# Patient Record
Sex: Male | Born: 1950 | Race: White | Hispanic: No | Marital: Married | Smoking: Never smoker
Health system: Southern US, Community
[De-identification: ages and names within clinical notes are randomized; demographics above are authoritative.]

## PROBLEM LIST (undated history)

## (undated) DIAGNOSIS — F419 Anxiety disorder, unspecified: Secondary | ICD-10-CM

## (undated) DIAGNOSIS — Z87442 Personal history of urinary calculi: Secondary | ICD-10-CM

## (undated) DIAGNOSIS — N400 Enlarged prostate without lower urinary tract symptoms: Secondary | ICD-10-CM

## (undated) DIAGNOSIS — F0390 Unspecified dementia without behavioral disturbance: Secondary | ICD-10-CM

## (undated) DIAGNOSIS — F32A Depression, unspecified: Secondary | ICD-10-CM

## (undated) HISTORY — PX: TOTAL HIP ARTHROPLASTY: SHX124

## (undated) HISTORY — DX: Unspecified dementia, unspecified severity, without behavioral disturbance, psychotic disturbance, mood disturbance, and anxiety: F03.90

---

## 2008-09-27 ENCOUNTER — Inpatient Hospital Stay (HOSPITAL_COMMUNITY): Admission: RE | Admit: 2008-09-27 | Discharge: 2008-09-29 | Payer: Self-pay | Admitting: Orthopedic Surgery

## 2011-03-26 NOTE — Discharge Summary (Signed)
Mike Pennington, Mike Pennington               ACCOUNT NO.:  [id number]   MEDICAL RECORD NO.:  [id number]          PATIENT TYPE:  INP   LOCATION:  NA                           FACILITY:  [facility]   PHYSICIAN:  Madlyn Frankel. Charlann Boxer, M.D.  DATE OF BIRTH:  [birth year]   DATE OF ADMISSION:  09/27/2008  DATE OF DISCHARGE:                               DISCHARGE SUMMARY   PROCEDURE:  Right total hip replacement.   CHIEF COMPLAINT:  Right hip pain.   HISTORY OF PRESENT ILLNESS:  A 60 year old male with a history of right  hip pain secondary to osteoarthritis.  It has been refractory to all  conservative treatment.  He has had diminished quality of life and  intractable pain.   PAST MEDICAL HISTORY:  Osteoarthritis.   PAST SURGICAL HISTORY:  Hand surgery.   FAMILY HISTORY:  Noncontributory.   SOCIAL HISTORY:  Nonsmoker.   DRUG ALLERGIES:  NO KNOWN DRUG ALLERGIES.   MEDICATIONS:  Advil p.r.n.   REVIEW OF SYSTEMS:  See HPI.   PHYSICAL EXAM:  Pulse 71.  Respirations 16.  Blood pressure 147/84.  Temperature 97.5.  GENERAL:  Awake, alert, oriented, well developed, well nourished,  healthy appearing.  NECK:  Supple.  No carotid bruits.  CHEST:  Lungs clear to auscultation bilaterally.  BREASTS:  Deferred.  HEART:  Regular rate and rhythm.  S1 and S2 distinct.  ABDOMEN:  Soft, nontender, nondistended.  Bowel sounds present.  GENITOURINARY:  Deferred.  EXTREMITIES:  Right hip, increase pain with decreased range of motion.  SKIN:  No cellulitis.  NEUROLOGIC:  Intact distal sensibilities.   LABS:  EKG, chest x-ray, all pending presurgical testing.   IMPRESSION:  Right hip osteoarthritis.   PLAN OF ACTION:  Right total hip replacement by surgeon, Dr. Durene Romans, on September 27, 2008, at [facility].  Risks and  complications discussed.  No postoperative medication provided at time  of history and physical.     ______________________________  Yetta Glassman. [address], [place]      Madlyn Frankel.  Charlann Boxer, M.D.  Electronically Signed    BLM/MEDQ  D:  09/27/2008  T:  09/27/2008  Job:  [id number]

## 2011-03-26 NOTE — Op Note (Signed)
NAMEJOREN, REHM               ACCOUNT NO.:  [id number]   MEDICAL RECORD NO.:  [id number]          PATIENT TYPE:  INP   LOCATION:  NA                           FACILITY:  [facility]   PHYSICIAN:  Madlyn Frankel. Charlann Boxer, M.D.  DATE OF BIRTH:  08/22/1951   DATE OF PROCEDURE:  09/27/2008  DATE OF DISCHARGE:                               OPERATIVE REPORT   PREOPERATIVE DIAGNOSIS:  Right hip osteoarthritis.   POSTOPERATIVE DIAGNOSIS:  Right hip osteoarthritis.   PROCEDURE:  Right total hip replacement.   COMPONENTS USED:  DePuy hip system size 58 pinnacle cup, single  cancellous screw and 40 mm neutral liner, a size 10 trial offset stem  with a 40, +5 ball.   SURGEON:  Madlyn Frankel. Charlann Boxer, M.D.   ASSISTANT:  Yetta Glassman. Mann, PA.   ANESTHESIA:  General.   BLOOD LOSS:  600 mL.   DRAINS:  Hemovac.   COMPLICATIONS:  None.   SPECIMEN:  None.   DISPOSITION:  Stable to the recovery room.   INDICATION FOR THE PROCEDURE:  Mr. Kwong is a 60 year old male who  presented with a 5-year history of right hip pain that has progressively  worsened.  It has significant reduced his quality of life.  He has not  had problems with his left hip.  Radiographs revealed end-stage  degenerative changes.  He had actually had a discussion about this hip  replaced in the past and was here for surgical consideration.  The risks  and benefits of the procedure were discussed.  Consent was obtained for  benefit of pain relief after reviewing the risks of infection, DVT,  component failure, dislocation, need for revision surgery also reviewing  the bearing surfaces available for his age.   PROCEDURE IN DETAIL:  The patient was brought to the operative theater.  Once adequate anesthesia and preoperative antibiotics, 2 grams of Ancef  administered the patient was positioned in the left lateral decubitus  position with the right side up.  The right lower extremity was pre-  scrubbed and prepped and draped in sterile  fashion.  A lateral based  incision was made for a posterior approach of the hip.  The gluteal  fascia and tensor fascia overlying the troch was incised posteriorly and  posterior exposure obtained.  The short external rotators were taken  down and separated from the posterior capsule.  An L capsulotomy was  made preserving the posterior capsule.  The hip was dislocated.  A neck  osteotomy was made into the trochanteric fossa, measuring it and  utilizing the 28 mm head, held in the center of the femoral head using  the 7 high offset neck.  Following this neck osteotomy, attended to the  femur first.  I used a box osteotome to remove bone out of the lateral  aspect of the proximal femur.  I then used a starting drill and a hand  reamer times one.  I then irrigated the canal to prevent fat emboli.  I  then began broaching with a size 2.  The broaching was carried out to a  size 8  broach initially.  I used a calcar planer to finish off a little  bit of the medial neck cut.   I packed off the femur at this point and I attended to the acetabulum.  Acetabulum was exposed and noted to have significant calcified labrum  circumferentially.  I used a rongeur, as well as an osteotome to debride  some of the posterior osteophytes, as well as anterior osteophytes.  I  began reaming with a 45 reamer and then went the all the way up  initially to a 55 reamer.  At this point, there was still a little bit  of medial osteophyte noted so I reamed down with the 56 and then 57 and  chose the 58 mm cup.  I impacted this cup which appeared to be about 20  degrees of forward flexion, 40 degrees of abduction.  Once I had this  securely fit with initial scratch fit and a single cancellous screw in  the ilium I used an osteotome to remove remaining osteophytic bone.  I  checked the position of my cup with the hip guide and was happy that the  cup appeared to be in about 35-40 degrees of abduction, 20 degrees of   flexion.  I placed a single cancellous screw into the ilium.  The hole  eliminator was placed and the final neutral 40 mm liner was placed.   This was done without complication.  Trial reduction was now carried  out.  Initially I put the 8 stem back in with a high offset neck and 40,  +1.45 ball.  With this in place the patient was noted to have a lot of  shuck in extension.  For this reason I went ahead and broached to a 9  which sat at the level neck cut and because there not much of difference  from where the 8 was I went up to a size 10.  With a size 10 stem in  place I trialed with a 41.5 ball again.  At this point there was about a  mm or 2 of shuck.  The stability of the hip excellent and no signs of  impingement.   The leg lengths compared to the down leg with the difference truly  determined based on the fact the patient had significant preoperative  stiffness.  However, there did not at this point did it appear that the  leg had been lengthened at all.   At this point, I removed the trial component.  We selected the 10 high  offset stem and it was impacted to the level where the broach had sat.  I retrialed again with a 1.5.  I then trialed with a 40, +5 ball.  With  this there was about a mm of shuck.  The superior hip was greatly giving  the hip leg length, but there did not appear to be lengthened and it  appeared to be stable.  Given all these parameters then the final 40, +5  ball was chosen and impacted onto the clean and dry trunnion.  Hip was  reduced.  The wound was irrigated and the hip throughout the case and  again at this point.  I reapproximated the posterior capsule to the  superior capsule, as well as the gluteus minimus fascia with a #1  Vicryl.  A medium Hemovac drain was placed deep in the capsular tissues.  The iliotibial band and tensor fascia and gluteal fascia were then all  reapproximated using #  1 Vicryl and over the top of this posterior wound.   The  remainder of the wound was closed with 2-0 Vicryl and running 4-0  Monocryl.  The hip was cleaned, dried, and dressed sterilely with Steri-  Strips with a Mepilex dressing.  He was brought to the recovery room,  extubated in stable condition tolerating the procedure well.      Madlyn Frankel Charlann Boxer, M.D.  Electronically Signed     MDO/MEDQ  D:  09/27/2008  T:  09/28/2008  Job:  [id number]

## 2011-08-13 LAB — BASIC METABOLIC PANEL
BUN: 11
BUN: 23
CO2: 26
CO2: 28
Calcium: 8.6
Calcium: 9.5
Chloride: 105
Creatinine, Ser: 0.88
GFR calc Af Amer: >60
GFR calc Af Amer: >60
Glucose, Bld: 152 — ABNORMAL HIGH
Glucose, Bld: 96
Potassium: 4.4
Sodium: 137
Sodium: 139

## 2011-08-13 LAB — URINALYSIS, ROUTINE W REFLEX MICROSCOPIC
Bilirubin Urine: NEGATIVE
Glucose, UA: NEGATIVE
Hgb urine dipstick: NEGATIVE
Ketones, ur: NEGATIVE
Nitrite: NEGATIVE
Protein, ur: NEGATIVE
pH: 6.5

## 2011-08-13 LAB — CBC
HCT: 36.2 — ABNORMAL LOW
HCT: 47.8
Hemoglobin: 12.6 — ABNORMAL LOW
MCHC: 33.6
MCHC: 34
MCV: 92.2
Platelets: 193
Platelets: 258
RBC: 5.19
RDW: 13.4
RDW: 13.6
WBC: 6.9
WBC: 7.5
WBC: 7.7

## 2011-08-13 LAB — TYPE AND SCREEN: Antibody Screen: NEGATIVE

## 2011-08-13 LAB — DIFFERENTIAL
Basophils Relative: 0
Eosinophils Relative: 4

## 2011-08-13 LAB — ABO/RH: ABO/RH(D): O POS

## 2011-08-13 LAB — PROTIME-INR: INR: 1.2

## 2019-12-06 ENCOUNTER — Ambulatory Visit: Payer: Self-pay | Attending: Internal Medicine

## 2019-12-06 DIAGNOSIS — Z23 Encounter for immunization: Secondary | ICD-10-CM | POA: Insufficient documentation

## 2019-12-06 NOTE — Progress Notes (Signed)
   Covid-19 Vaccination Clinic  Name:  Mike Pennington    MRN: [id number] DOB: April 19, 1951  12/06/2019  Mike Pennington was observed post Covid-19 immunization for 15 minutes without incidence. He was provided with Vaccine Information Sheet and instruction to access the V-Safe system.   Mike Pennington was instructed to call 911 with any severe reactions post vaccine: Marland Kitchen Difficulty breathing  . Swelling of your face and throat  . A fast heartbeat  . A bad rash all over your body  . Dizziness and weakness    Immunizations Administered    Name Date Dose VIS Date Route   Pfizer COVID-19 Vaccine 12/06/2019  1:41 PM 0.3 mL 10/22/2019 Intramuscular   Manufacturer: [place]   Lot: [id number]   [place]: [id number]

## 2019-12-08 ENCOUNTER — Ambulatory Visit: Payer: Self-pay

## 2019-12-16 ENCOUNTER — Ambulatory Visit: Payer: Self-pay | Attending: Internal Medicine

## 2019-12-24 ENCOUNTER — Ambulatory Visit: Payer: Self-pay

## 2019-12-24 ENCOUNTER — Ambulatory Visit: Payer: Self-pay | Attending: Internal Medicine

## 2019-12-24 DIAGNOSIS — Z23 Encounter for immunization: Secondary | ICD-10-CM | POA: Insufficient documentation

## 2019-12-24 NOTE — Progress Notes (Signed)
   Covid-19 Vaccination Clinic  Name:  Mike Pennington    MRN: [id number] DOB: Sep 11, 1951  12/24/2019  [name] was observed post Covid-19 immunization for 15 minutes without incidence. He was provided with Vaccine Information Sheet and instruction to access the V-Safe system.   [name] was instructed to call 911 with any severe reactions post vaccine: Marland Kitchen Difficulty breathing  . Swelling of your face and throat  . A fast heartbeat  . A bad rash all over your body  . Dizziness and weakness    Immunizations Administered    Name Date Dose VIS Date Route   Pfizer COVID-19 Vaccine 12/24/2019  1:15 PM 0.3 mL 10/22/2019 Intramuscular   Manufacturer: [place]   Lot: [id number]   [place]: [id number]

## 2019-12-25 ENCOUNTER — Ambulatory Visit: Payer: Self-pay

## 2020-03-22 ENCOUNTER — Other Ambulatory Visit: Payer: Self-pay | Admitting: Orthopedic Surgery

## 2020-03-22 DIAGNOSIS — M25551 Pain in right hip: Secondary | ICD-10-CM

## 2020-05-01 ENCOUNTER — Ambulatory Visit
Admission: RE | Admit: 2020-05-01 | Discharge: 2020-05-01 | Disposition: A | Discharge status: Home / Self Care | Payer: Medicare PPO | Source: Ambulatory Visit | Attending: Orthopedic Surgery | Admitting: Orthopedic Surgery

## 2020-05-01 DIAGNOSIS — M25551 Pain in right hip: Secondary | ICD-10-CM

## 2020-05-19 ENCOUNTER — Other Ambulatory Visit: Payer: Self-pay | Admitting: Urology

## 2020-05-19 DIAGNOSIS — R972 Elevated prostate specific antigen [PSA]: Secondary | ICD-10-CM

## 2020-06-17 ENCOUNTER — Other Ambulatory Visit: Payer: Medicare PPO

## 2020-06-18 ENCOUNTER — Inpatient Hospital Stay: Admission: RE | Admit: 2020-06-18 | Payer: Medicare PPO | Source: Ambulatory Visit

## 2020-06-28 ENCOUNTER — Ambulatory Visit (HOSPITAL_COMMUNITY): Payer: Medicare PPO

## 2020-06-30 ENCOUNTER — Other Ambulatory Visit (HOSPITAL_COMMUNITY): Payer: Self-pay | Admitting: Urology

## 2020-06-30 DIAGNOSIS — R972 Elevated prostate specific antigen [PSA]: Secondary | ICD-10-CM

## 2020-07-05 ENCOUNTER — Ambulatory Visit (HOSPITAL_COMMUNITY): Payer: Medicare PPO

## 2020-07-05 ENCOUNTER — Ambulatory Visit (HOSPITAL_COMMUNITY)
Admission: RE | Admit: 2020-07-05 | Discharge: 2020-07-05 | Disposition: A | Discharge status: Home / Self Care | Payer: Medicare PPO | Source: Ambulatory Visit | Attending: Urology | Admitting: Urology

## 2020-07-05 ENCOUNTER — Other Ambulatory Visit: Payer: Self-pay

## 2020-07-05 DIAGNOSIS — R972 Elevated prostate specific antigen [PSA]: Secondary | ICD-10-CM | POA: Insufficient documentation

## 2020-07-05 MED ORDER — GADOBUTROL 1 MMOL/ML IV SOLN
9.0000 mL | Freq: Once | INTRAVENOUS | Status: AC | PRN
  Administered 2020-07-05: 9 mL via INTRAVENOUS

## 2021-05-07 ENCOUNTER — Emergency Department (HOSPITAL_BASED_OUTPATIENT_CLINIC_OR_DEPARTMENT_OTHER): Payer: Medicare PPO

## 2021-05-07 ENCOUNTER — Encounter (HOSPITAL_BASED_OUTPATIENT_CLINIC_OR_DEPARTMENT_OTHER): Payer: Self-pay | Admitting: *Deleted

## 2021-05-07 ENCOUNTER — Emergency Department (HOSPITAL_BASED_OUTPATIENT_CLINIC_OR_DEPARTMENT_OTHER)
Admission: EM | Admit: 2021-05-07 | Discharge: 2021-05-07 | Disposition: A | Discharge status: Home / Self Care | Payer: Medicare PPO | Attending: Emergency Medicine | Admitting: Emergency Medicine

## 2021-05-07 ENCOUNTER — Other Ambulatory Visit: Payer: Self-pay

## 2021-05-07 DIAGNOSIS — F0390 Unspecified dementia without behavioral disturbance: Secondary | ICD-10-CM | POA: Insufficient documentation

## 2021-05-07 DIAGNOSIS — K5900 Constipation, unspecified: Secondary | ICD-10-CM

## 2021-05-07 DIAGNOSIS — K640 First degree hemorrhoids: Secondary | ICD-10-CM | POA: Diagnosis not present

## 2021-05-07 DIAGNOSIS — Z96641 Presence of right artificial hip joint: Secondary | ICD-10-CM | POA: Insufficient documentation

## 2021-05-07 DIAGNOSIS — K6289 Other specified diseases of anus and rectum: Secondary | ICD-10-CM | POA: Diagnosis present

## 2021-05-07 LAB — BASIC METABOLIC PANEL
Anion gap: 8 (ref 5–15)
BUN: 32 mg/dL — ABNORMAL HIGH (ref 8–23)
CO2: 22 mmol/L (ref 22–32)
Calcium: 9 mg/dL (ref 8.9–10.3)
Chloride: 106 mmol/L (ref 98–111)
Creatinine, Ser: 1 mg/dL (ref 0.61–1.24)
GFR, Estimated: >60 mL/min (ref >60)
Glucose, Bld: 104 mg/dL — ABNORMAL HIGH (ref 70–99)
Potassium: 4 mmol/L (ref 3.5–5.1)
Sodium: 136 mmol/L (ref 135–145)

## 2021-05-07 MED ORDER — ACETAMINOPHEN 500 MG PO TABS
1000.0000 mg | ORAL_TABLET | Freq: Once | ORAL | Status: AC
  Administered 2021-05-07: 1000 mg via ORAL
  Filled 2021-05-07: qty 2

## 2021-05-07 MED ORDER — MINERAL OIL RE ENEM
1.0000 | ENEMA | Freq: Once | RECTAL | Status: AC
  Administered 2021-05-07: 1 via RECTAL
  Filled 2021-05-07: qty 1

## 2021-05-07 NOTE — ED Notes (Signed)
Patient transported to X-ray 

## 2021-05-07 NOTE — ED Notes (Signed)
Pt sts able to hold enema for 15 minutes. Currently on bedside commode attempting to have a bowel movement.

## 2021-05-07 NOTE — ED Provider Notes (Signed)
[place] EMERGENCY DEPARTMENT Provider Note   CSN: [id number] Arrival date & time: 05/07/21  0026     History Chief Complaint  Patient presents with   Rectal Pain    Mike Pennington is a 70 y.o. male.  The history is provided by the spouse. The history is limited by the condition of the patient (level 5 caveat dementia).  Illness Location:  Rectum Quality:  Pain Severity:  Severe Onset quality:  Gradual Duration:  1 day Timing:  Constant Progression:  Unchanged Chronicity:  New Context:  Dementia Relieved by:  Nothing Worsened by:  Nothing Ineffective treatments:  None Associated symptoms: no abdominal pain, no chest pain, no diarrhea, no fever, no vomiting and no wheezing   Risk factors:  Dementia     Past Medical History:  Diagnosis Date   Dementia ([place])     There are no problems to display for this patient.   Past Surgical History:  Procedure Laterality Date   TOTAL HIP ARTHROPLASTY Right        History reviewed. No pertinent family history.  Social History   Tobacco Use   Smoking status: Never   Smokeless tobacco: Never  Substance Use Topics   Alcohol use: Never   Drug use: Never    Home Medications Prior to Admission medications   Not on File    Allergies    Patient has no known allergies.  Review of Systems   Review of Systems  Unable to perform ROS: Dementia  Constitutional:  Negative for fever.  HENT:  Negative for drooling.   Eyes:  Negative for redness.  Respiratory:  Negative for wheezing.   Cardiovascular:  Negative for chest pain.  Gastrointestinal:  Negative for abdominal pain, diarrhea and vomiting.  Skin:  Negative for wound.  Psychiatric/Behavioral:  Negative for agitation.    Physical Exam Updated Vital Signs BP (!) 158/98   Pulse 71   Temp 97.9 F (36.6 C) (Oral)   Resp 18   Ht 6' (1.829 m)   Wt 83.9 kg   SpO2 99%   BMI 25.09 kg/m   Physical Exam Vitals and nursing note reviewed. Exam  conducted with a chaperone present.  Constitutional:      General: He is not in acute distress.    Appearance: Normal appearance.  HENT:     Head: Normocephalic and atraumatic.     Nose: Nose normal.  Eyes:     Conjunctiva/sclera: Conjunctivae normal.     Pupils: Pupils are equal, round, and reactive to light.  Cardiovascular:     Rate and Rhythm: Normal rate and regular rhythm.     Pulses: Normal pulses.     Heart sounds: Normal heart sounds.  Pulmonary:     Effort: Pulmonary effort is normal.     Breath sounds: Normal breath sounds.  Abdominal:     General: Abdomen is flat. Bowel sounds are normal.     Palpations: Abdomen is soft.     Tenderness: There is no abdominal tenderness. There is no guarding or rebound.  Genitourinary:    Comments: Small hemorrhoid  Musculoskeletal:        General: Normal range of motion.     Cervical back: Normal range of motion.  Skin:    General: Skin is warm and dry.     Capillary Refill: Capillary refill takes less than 2 seconds.  Neurological:     Mental Status: He is alert.     Deep Tendon Reflexes: Reflexes normal.  Psychiatric:        Mood and Affect: Mood normal.        Behavior: Behavior normal.    ED Results / Procedures / Treatments   Labs (all labs ordered are listed, but only abnormal results are displayed) Labs Reviewed  BASIC METABOLIC PANEL    EKG None  Radiology DG Abdomen 1 View  Result Date: 05/07/2021 CLINICAL DATA:  Constipation, rectal pain EXAM: ABDOMEN - 1 VIEW COMPARISON:  11/20/2020 FINDINGS: Large stool burden throughout the colon, similar to prior study, including rectosigmoid colon. Cannot exclude fecal impaction. Findings similar to prior study. No organomegaly, free air or suspicious calcification. IMPRESSION: Large stool burden throughout the colon, including rectosigmoid colon. Cannot exclude fecal impaction. Electronically Signed   By: Rolm Baptise M.D.   On: 05/07/2021 02:01    Procedures Procedures    Medications Ordered in ED Medications  acetaminophen (TYLENOL) tablet 1,000 mg (1,000 mg Oral Given 05/07/21 0226)  mineral oil enema 1 enema (1 enema Rectal Given 05/07/21 0231)    ED Course  I have reviewed the triage vital signs and the nursing notes.  Pertinent labs & imaging results that were available during my care of the patient were reviewed by me and considered in my medical decision making (see chart for details).   Given emema.  Will need to start daily miralax therapy and sitz baths and preparation H.    Mike Pennington was evaluated in Emergency Department on 05/07/2021 for the symptoms described in the history of present illness. He was evaluated in the context of the global COVID-19 pandemic, which necessitated consideration that the patient might be at risk for infection with the SARS-CoV-2 virus that causes COVID-19. Institutional protocols and algorithms that pertain to the evaluation of patients at risk for COVID-19 are in a state of rapid change based on information released by regulatory bodies including the CDC and federal and state organizations. These policies and algorithms were followed during the patient's care in the ED.  Final Clinical Impression(s) / ED Diagnoses Final diagnoses:  Grade I hemorrhoids  Constipation, unspecified constipation type    Return for intractable cough, coughing up blood, fevers > 100.4 unrelieved by medication, shortness of breath, intractable vomiting, chest pain, shortness of breath, weakness, numbness, changes in speech, facial asymmetry, abdominal pain, passing out, Inability to tolerate liquids or food, cough, altered mental status or any concerns. No signs of systemic illness or infection. The patient is nontoxic-appearing on exam and vital signs are within normal limits. I have reviewed the triage vital signs and the nursing notes. Pertinent labs & imaging results that were available during my care of the patient were reviewed by  me and considered in my medical decision making (see chart for details). After history, exam, and medical workup I feel the patient has been appropriately medically screened and is safe for discharge home. Pertinent diagnoses were discussed with the patient. Patient was given return precautions.  Rx / DC Orders ED Discharge Orders     None        Maezie Justin, MD 05/07/21 6754

## 2021-05-07 NOTE — ED Triage Notes (Signed)
Rectal pain since yesterday. Denies abdominal pain. Denies rectal bleeding.  Pts wife-Linda- at bedside-pt has hx of dementia.

## 2021-05-07 NOTE — ED Notes (Signed)
Pt had a large bowel movement. Reports relief and feeling better. Ambulatory around room afterwards.

## 2021-05-24 ENCOUNTER — Other Ambulatory Visit: Payer: Self-pay

## 2021-05-24 ENCOUNTER — Emergency Department (HOSPITAL_BASED_OUTPATIENT_CLINIC_OR_DEPARTMENT_OTHER)
Admission: EM | Admit: 2021-05-24 | Discharge: 2021-05-25 | Disposition: A | Discharge status: Home / Self Care | Payer: Medicare PPO | Attending: Emergency Medicine | Admitting: Emergency Medicine

## 2021-05-24 ENCOUNTER — Encounter (HOSPITAL_BASED_OUTPATIENT_CLINIC_OR_DEPARTMENT_OTHER): Payer: Self-pay

## 2021-05-24 DIAGNOSIS — F0390 Unspecified dementia without behavioral disturbance: Secondary | ICD-10-CM | POA: Insufficient documentation

## 2021-05-24 DIAGNOSIS — R1030 Lower abdominal pain, unspecified: Secondary | ICD-10-CM | POA: Diagnosis not present

## 2021-05-24 DIAGNOSIS — Z96641 Presence of right artificial hip joint: Secondary | ICD-10-CM | POA: Insufficient documentation

## 2021-05-24 DIAGNOSIS — R339 Retention of urine, unspecified: Secondary | ICD-10-CM | POA: Diagnosis not present

## 2021-05-24 DIAGNOSIS — R338 Other retention of urine: Secondary | ICD-10-CM

## 2021-05-24 LAB — COMPREHENSIVE METABOLIC PANEL
ALT: 21 U/L (ref 0–44)
AST: 30 U/L (ref 15–41)
Albumin: 4.5 g/dL (ref 3.5–5.0)
Alkaline Phosphatase: 54 U/L (ref 38–126)
Anion gap: 8 (ref 5–15)
BUN: 29 mg/dL — ABNORMAL HIGH (ref 8–23)
CO2: 25 mmol/L (ref 22–32)
Calcium: 9.4 mg/dL (ref 8.9–10.3)
Chloride: 103 mmol/L (ref 98–111)
Creatinine, Ser: 1.33 mg/dL — ABNORMAL HIGH (ref 0.61–1.24)
GFR, Estimated: 58 mL/min — ABNORMAL LOW (ref >60)
Glucose, Bld: 103 mg/dL — ABNORMAL HIGH (ref 70–99)
Potassium: 4.3 mmol/L (ref 3.5–5.1)
Sodium: 136 mmol/L (ref 135–145)
Total Bilirubin: 1 mg/dL (ref 0.3–1.2)
Total Protein: 7.6 g/dL (ref 6.5–8.1)

## 2021-05-24 LAB — CBC
HCT: 57.1 % — ABNORMAL HIGH (ref 39.0–52.0)
Hemoglobin: 18.9 g/dL — ABNORMAL HIGH (ref 13.0–17.0)
MCH: 26.9 pg (ref 26.0–34.0)
MCHC: 33.1 g/dL (ref 30.0–36.0)
MCV: 81.2 fL (ref 80.0–100.0)
Platelets: 293 10*3/uL (ref 150–400)
RBC: 7.03 MIL/uL — ABNORMAL HIGH (ref 4.22–5.81)
RDW: 23.9 % — ABNORMAL HIGH (ref 11.5–15.5)
WBC: 12.6 10*3/uL — ABNORMAL HIGH (ref 4.0–10.5)
nRBC: 0 % (ref 0.0–0.2)

## 2021-05-24 LAB — LIPASE, BLOOD: Lipase: 27 U/L (ref 11–51)

## 2021-05-24 NOTE — ED Triage Notes (Addendum)
Per wife pt with abd pain x 2 days-states he feels the need to void-goes to BR with no output then is incontinent of urine-states he was recently seen for constipation however normal BM/baseline today-NAD-answers some ?s-wife answers most ?s/pt with dementia hx-unsteady gait/baseline

## 2021-05-25 LAB — URINALYSIS, ROUTINE W REFLEX MICROSCOPIC
Bilirubin Urine: NEGATIVE
Glucose, UA: NEGATIVE mg/dL
Ketones, ur: NEGATIVE mg/dL
Nitrite: NEGATIVE
Protein, ur: 30 mg/dL — AB
Specific Gravity, Urine: 1.02 (ref 1.005–1.030)
pH: 5.5 (ref 5.0–8.0)

## 2021-05-25 LAB — URINALYSIS, MICROSCOPIC (REFLEX)

## 2021-05-25 NOTE — ED Notes (Signed)
Discharge instructions discussed with pt and wife. Pt and wife verbalized understanding. Pt stable and ambulatory. No signature pad available.

## 2021-05-25 NOTE — ED Provider Notes (Signed)
[place] DEPT [facility] Provider Note: [name], MD, FACEP  CSN: [id number] MRN: [id number] ARRIVAL: 05/24/21 at 2129 ROOM: MH10/MH10   CHIEF COMPLAINT  Abdominal Pain  Level 5 caveat: Dementia HISTORY OF PRESENT ILLNESS  05/25/21 12:45 AM Mike Pennington is a 70 y.o. male with a history of dementia.  His wife states he has been complaining of a sharp suprapubic pain for the past 2 days.  Sometimes when he attempts to urinate he is incontinent of small amounts of stool.  He was seen several weeks ago for constipation and has been on a regimen including MiraLAX and Colace with improvement.  His bowel movements have been fairly normal recently.  He feels the need to urinate but has difficulty voiding and then after attempting to urinate he is incontinent of urine.  He is unable to quantify his pain and denies any pain at the present time.   Past Medical History:  Diagnosis Date   Dementia [facility])     Past Surgical History:  Procedure Laterality Date   TOTAL HIP ARTHROPLASTY Right     No family history on file.  Social History   Tobacco Use   Smoking status: Never   Smokeless tobacco: Never  Substance Use Topics   Alcohol use: Never   Drug use: Never    Prior to Admission medications   Not on File    Allergies Patient has no known allergies.   REVIEW OF SYSTEMS  Level 5 caveat: Dementia   PHYSICAL EXAMINATION  Initial Vital Signs Blood pressure (!) 162/103, pulse 87, temperature 98.3 F (36.8 C), temperature source Oral, resp. rate 18, height 6' (1.829 m), weight 81.6 kg, SpO2 97 %.  Examination General: Well-developed, well-nourished male in no acute distress; appearance consistent with age of record HENT: normocephalic; atraumatic Eyes: Normal appearance Neck: supple Heart: regular rate and rhythm Lungs: clear to auscultation bilaterally Abdomen: soft; distended but nontender bladder, bedside bladder scan >468 mL; bowel sounds present Extremities:  No deformity; full range of motion; pulses normal Neurologic: Awake, alert; motor function intact in all extremities and symmetric; no facial droop Skin: Warm and dry Psychiatric: Flat affect   RESULTS  Summary of this visit's results, reviewed and interpreted by myself:   EKG Interpretation  Date/Time:    Ventricular Rate:    PR Interval:    QRS Duration:   QT Interval:    QTC Calculation:   R Axis:     Text Interpretation:         Laboratory Studies: Results for orders placed or performed during the hospital encounter of 05/24/21 (from the past 24 hour(s))  Lipase, blood     Status: None   Collection Time: 05/24/21 10:08 PM  Result Value Ref Range   Lipase 27 11 - 51 U/L  Comprehensive metabolic panel     Status: Abnormal   Collection Time: 05/24/21 10:08 PM  Result Value Ref Range   Sodium 136 135 - 145 mmol/L   Potassium 4.3 3.5 - 5.1 mmol/L   Chloride 103 98 - 111 mmol/L   CO2 25 22 - 32 mmol/L   Glucose, Bld 103 (H) 70 - 99 mg/dL   BUN 29 (H) 8 - 23 mg/dL   Creatinine, Ser 1.33 (H) 0.61 - 1.24 mg/dL   Calcium 9.4 8.9 - 10.3 mg/dL   Total Protein 7.6 6.5 - 8.1 g/dL   Albumin 4.5 3.5 - 5.0 g/dL   AST 30 15 - 41 U/L   ALT 21  0 - 44 U/L   Alkaline Phosphatase 54 38 - 126 U/L   Total Bilirubin 1.0 0.3 - 1.2 mg/dL   GFR, Estimated 58 (L) >60 mL/min   Anion gap 8 5 - 15  CBC     Status: Abnormal   Collection Time: 05/24/21 10:08 PM  Result Value Ref Range   WBC 12.6 (H) 4.0 - 10.5 K/uL   RBC 7.03 (H) 4.22 - 5.81 MIL/uL   Hemoglobin 18.9 (H) 13.0 - 17.0 g/dL   HCT 57.1 (H) 39.0 - 52.0 %   MCV 81.2 80.0 - 100.0 fL   MCH 26.9 26.0 - 34.0 pg   MCHC 33.1 30.0 - 36.0 g/dL   RDW 23.9 (H) 11.5 - 15.5 %   Platelets 293 150 - 400 K/uL   nRBC 0.0 0.0 - 0.2 %  Urinalysis, Routine w reflex microscopic Urine, Clean Catch     Status: Abnormal   Collection Time: 05/24/21 11:00 PM  Result Value Ref Range   Color, Urine YELLOW YELLOW   APPearance CLEAR CLEAR   Specific  Gravity, Urine 1.020 1.005 - 1.030   pH 5.5 5.0 - 8.0   Glucose, UA NEGATIVE NEGATIVE mg/dL   Hgb urine dipstick MODERATE (A) NEGATIVE   Bilirubin Urine NEGATIVE NEGATIVE   Ketones, ur NEGATIVE NEGATIVE mg/dL   Protein, ur 30 (A) NEGATIVE mg/dL   Nitrite NEGATIVE NEGATIVE   Leukocytes,Ua TRACE (A) NEGATIVE  Urinalysis, Microscopic (reflex)     Status: Abnormal   Collection Time: 05/24/21 11:00 PM  Result Value Ref Range   RBC / HPF 6-10 0 - 5 RBC/hpf   WBC, UA 0-5 0 - 5 WBC/hpf   Bacteria, UA RARE (A) NONE SEEN   Squamous Epithelial / LPF 0-5 0 - 5   Mucus PRESENT    Imaging Studies: No results found.  ED COURSE and MDM  Nursing notes, initial and subsequent vitals signs, including pulse oximetry, reviewed and interpreted by myself.  Vitals:   05/25/21 0000 05/25/21 0015 05/25/21 0030 05/25/21 0045  BP: (!) 154/103 (!) 147/100 (!) 152/101 (!) 146/100  Pulse: 80 82 81 80  Resp:    18  Temp:      TempSrc:      SpO2: 94% 94% 95% 95%  Weight:      Height:       Medications - No data to display  1:21 AM Foley catheter placed by nursing staff.  More than 1500 mL of amber urine obtained.  Bladder decompressed.  Patient tolerated this well.  We will send him home with a leg bag and refer him to urology.  His wife states he does have a history of enlarged prostate.  PROCEDURES  Procedures   ED DIAGNOSES     ICD-10-CM   1. Acute urinary retention  R33.8          Shanon Rosser, MD 05/25/21 [phone]

## 2021-05-28 ENCOUNTER — Telehealth: Payer: Self-pay | Admitting: Student

## 2021-05-28 NOTE — Telephone Encounter (Signed)
I returned a phone call to Mike Pennington.  He was in the emergency room 3 days ago for urinary retention had a Foley catheter placed.  Foley catheter got tugged on slightly today and his urine became bloody.  It is still draining and he is comfortable.  I did a secure video chat using D[employer] the urine appeared thin and was draining in the tubing.  I encouraged p.o. hydration and to be careful with the catheter.  And to monitor the output.  If it became thicker then they may need the catheter exchanged and manually flushed.  They expressed gratitude and understanding.

## 2021-06-02 ENCOUNTER — Emergency Department (HOSPITAL_BASED_OUTPATIENT_CLINIC_OR_DEPARTMENT_OTHER)
Admission: EM | Admit: 2021-06-02 | Discharge: 2021-06-02 | Disposition: A | Discharge status: Home / Self Care | Payer: Medicare PPO | Attending: Emergency Medicine | Admitting: Emergency Medicine

## 2021-06-02 ENCOUNTER — Encounter (HOSPITAL_BASED_OUTPATIENT_CLINIC_OR_DEPARTMENT_OTHER): Payer: Self-pay

## 2021-06-02 ENCOUNTER — Telehealth (HOSPITAL_BASED_OUTPATIENT_CLINIC_OR_DEPARTMENT_OTHER): Payer: Self-pay | Admitting: Physician Assistant

## 2021-06-02 ENCOUNTER — Other Ambulatory Visit: Payer: Self-pay

## 2021-06-02 DIAGNOSIS — Z96641 Presence of right artificial hip joint: Secondary | ICD-10-CM | POA: Insufficient documentation

## 2021-06-02 DIAGNOSIS — F0390 Unspecified dementia without behavioral disturbance: Secondary | ICD-10-CM | POA: Diagnosis not present

## 2021-06-02 DIAGNOSIS — T83091A Other mechanical complication of indwelling urethral catheter, initial encounter: Secondary | ICD-10-CM | POA: Diagnosis present

## 2021-06-02 DIAGNOSIS — Y848 Other medical procedures as the cause of abnormal reaction of the patient, or of later complication, without mention of misadventure at the time of the procedure: Secondary | ICD-10-CM | POA: Diagnosis not present

## 2021-06-02 DIAGNOSIS — T839XXA Unspecified complication of genitourinary prosthetic device, implant and graft, initial encounter: Secondary | ICD-10-CM

## 2021-06-02 LAB — BASIC METABOLIC PANEL
Anion gap: 8 (ref 5–15)
BUN: 19 mg/dL (ref 8–23)
CO2: 25 mmol/L (ref 22–32)
Calcium: 9 mg/dL (ref 8.9–10.3)
Chloride: 100 mmol/L (ref 98–111)
Creatinine, Ser: 1.11 mg/dL (ref 0.61–1.24)
GFR, Estimated: >60 mL/min (ref >60)
Glucose, Bld: 122 mg/dL — ABNORMAL HIGH (ref 70–99)
Potassium: 4.6 mmol/L (ref 3.5–5.1)
Sodium: 133 mmol/L — ABNORMAL LOW (ref 135–145)

## 2021-06-02 LAB — CBC WITH DIFFERENTIAL/PLATELET
Abs Immature Granulocytes: 0.02 10*3/uL (ref 0.00–0.07)
Basophils Absolute: 0 10*3/uL (ref 0.0–0.1)
Basophils Relative: 1 %
Eosinophils Absolute: 0.1 10*3/uL (ref 0.0–0.5)
Eosinophils Relative: 1 %
HCT: 54 % — ABNORMAL HIGH (ref 39.0–52.0)
Hemoglobin: 17.7 g/dL — ABNORMAL HIGH (ref 13.0–17.0)
Immature Granulocytes: 0 %
Lymphocytes Relative: 11 %
Lymphs Abs: 0.7 10*3/uL (ref 0.7–4.0)
MCH: 26.7 pg (ref 26.0–34.0)
MCHC: 32.8 g/dL (ref 30.0–36.0)
MCV: 81.4 fL (ref 80.0–100.0)
Monocytes Absolute: 0.5 10*3/uL (ref 0.1–1.0)
Monocytes Relative: 8 %
Neutro Abs: 4.9 10*3/uL (ref 1.7–7.7)
Neutrophils Relative %: 79 %
Platelets: 266 10*3/uL (ref 150–400)
RBC: 6.63 MIL/uL — ABNORMAL HIGH (ref 4.22–5.81)
RDW: 22.5 % — ABNORMAL HIGH (ref 11.5–15.5)
Smear Review: NORMAL
WBC Morphology: >10
WBC: 6.1 10*3/uL (ref 4.0–10.5)
nRBC: 0 % (ref 0.0–0.2)

## 2021-06-02 LAB — URINALYSIS, ROUTINE W REFLEX MICROSCOPIC
Bilirubin Urine: NEGATIVE
Glucose, UA: NEGATIVE mg/dL
Ketones, ur: NEGATIVE mg/dL
Nitrite: NEGATIVE
Protein, ur: NEGATIVE mg/dL
Specific Gravity, Urine: <1.005 — ABNORMAL LOW (ref 1.005–1.030)
pH: 6 (ref 5.0–8.0)

## 2021-06-02 LAB — URINALYSIS, MICROSCOPIC (REFLEX)

## 2021-06-02 MED ORDER — HYDROXYZINE HCL 25 MG PO TABS: 25.0000 mg | ORAL_TABLET | Freq: Four times a day (QID) | ORAL | 0 refills | Status: DC

## 2021-06-02 NOTE — ED Provider Notes (Signed)
[facility] EMERGENCY DEPARTMENT Provider Note   CSN: [id number] Arrival date & time: 06/02/21  0957     History Chief Complaint  Patient presents with   Foley Catheter Issue    Mike Pennington is a 70 y.o. male with past medical history significant for dementia who presents for evaluation of decreased urine in his Foley catheter.  Was seen here approximately 1 week ago for urinary retention.  Was seen by urology in the outpatient setting.  Started on Flomax.  They are supposed to follow-up on Monday to have Foley catheter removed.  Wife noticed today that there has not been very much output in the Foley catheter.  She noticed her urine has been darker than normal and she is questioning as there is some blood in this.  Patient has denied any complaints.  He is eating and drinking as normal.  Wife did state that patient has not slept very well since having the Foley catheter placed.  She is unsure if this is due to the pain from the Foley catheter. No fever, chills, emesis abdominal pain, flank pain, back pain, diarrhea.  No agitation.  Denies additional aggravating or alleviating factors.  Wife states patient is at his baseline mentation. Apparently the foley cath was tugged at 5 days ago subsequently leading to urology evaluation where they replaced the Foley catheter.  They were not started on any antibiotics per wife  History obtained from patient, wife and past medical records.  No interpreter used.  HPI     Past Medical History:  Diagnosis Date   Dementia ([place])     There are no problems to display for this patient.   Past Surgical History:  Procedure Laterality Date   TOTAL HIP ARTHROPLASTY Right        History reviewed. No pertinent family history.  Social History   Tobacco Use   Smoking status: Never   Smokeless tobacco: Never  Substance Use Topics   Alcohol use: Never   Drug use: Never    Home Medications Prior to Admission medications   Medication  Sig Start Date End Date Taking? Authorizing Provider  hydrOXYzine (ATARAX/VISTARIL) 25 MG tablet Take 1 tablet (25 mg total) by mouth every 6 (six) hours. 06/02/21  Yes Hasini Peachey A, PA-C    Allergies    Patient has no known allergies.  Review of Systems   Review of Systems  Constitutional: Negative.   HENT: Negative.    Respiratory: Negative.    Cardiovascular: Negative.   Gastrointestinal: Negative.   Genitourinary:        Decreased urination  Musculoskeletal: Negative.   Skin: Negative.   Neurological: Negative.   All other systems reviewed and are negative.  Physical Exam Updated Vital Signs BP (!) 150/102 (BP Location: Left Arm)   Pulse 63   Temp 98.1 F (36.7 C) (Oral)   Resp 18   Ht 6' (1.829 m)   Wt 83.9 kg   SpO2 97%   BMI 25.09 kg/m   Physical Exam Vitals and nursing note reviewed.  Constitutional:      General: He is not in acute distress.    Appearance: He is well-developed. He is not ill-appearing, toxic-appearing or diaphoretic.  HENT:     Head: Normocephalic and atraumatic.     Nose: Nose normal.     Mouth/Throat:     Mouth: Mucous membranes are moist.  Eyes:     Pupils: Pupils are equal, round, and reactive to light.  Cardiovascular:  Rate and Rhythm: Normal rate and regular rhythm.     Pulses: Normal pulses.     Heart sounds: Normal heart sounds.  Pulmonary:     Effort: Pulmonary effort is normal. No respiratory distress.     Breath sounds: Normal breath sounds.  Abdominal:     General: Bowel sounds are normal. There is no distension.     Palpations: Abdomen is soft.     Tenderness: There is no abdominal tenderness. There is no right CVA tenderness, left CVA tenderness or guarding.  Genitourinary:    Comments: Foley cath presents. Small dried blood at urethral meatus. No lacerations, contusions Musculoskeletal:        General: Normal range of motion.     Cervical back: Normal range of motion and neck supple.     Comments: Moves  all 4 extremities without difficulty  Skin:    General: Skin is warm and dry.     Capillary Refill: Capillary refill takes less than 2 seconds.  Neurological:     Mental Status: He is alert. Mental status is at baseline.     Comments: At baseline per wife.  He follows commands.    ED Results / Procedures / Treatments   Labs (all labs ordered are listed, but only abnormal results are displayed) Labs Reviewed  CBC WITH DIFFERENTIAL/PLATELET - Abnormal; Notable for the following components:      Result Value   RBC 6.63 (*)    Hemoglobin 17.7 (*)    HCT 54.0 (*)    RDW 22.5 (*)    All other components within normal limits  BASIC METABOLIC PANEL - Abnormal; Notable for the following components:   Sodium 133 (*)    Glucose, Bld 122 (*)    All other components within normal limits  URINALYSIS, ROUTINE W REFLEX MICROSCOPIC - Abnormal; Notable for the following components:   Specific Gravity, Urine <1.005 (*)    Hgb urine dipstick LARGE (*)    Leukocytes,Ua TRACE (*)    All other components within normal limits  URINALYSIS, MICROSCOPIC (REFLEX) - Abnormal; Notable for the following components:   Bacteria, UA FEW (*)    All other components within normal limits    EKG None  Radiology No results found.  Procedures Procedures   Medications Ordered in ED Medications - No data to display  ED Course  I have reviewed the triage vital signs and the nursing notes.  Pertinent labs & imaging results that were available during my care of the patient were reviewed by me and considered in my medical decision making (see chart for details).  Here for evaluation of decreased urine from Foley catheter bag.  On evaluation patient at baseline mentation per wife in room who provides history given dementia.  Foley cath initially placed for retention 1 week ago.  They have follow-up appointment with urology on Monday.  Seen a few days ago by urology due to patient tugging at Foley catheter.  Wife  does state patient has had decreased sleep at night due to the Foley catheter and unable to get comfortable apparently.  Heart and lungs clear.  Abdomen soft, nontender.  Does have some dried blood at his urethral meatus however no obvious traumatic injuries.  Upon adjusting tubing of Foley catheter significant output into Foley bag.  Suspect Foley catheter was kinked.  Has some mildly dark urine however no gross clots.  Abdomen soft, nontender.  Labs were obtained which showed improvement in his creatinine from baseline.  CBC  without significant normality.  Urine negative for infection.  We did replace patient's leg bag with something with a longer cord which has a lock mechanism so tubing would not tug.  Did place orders for small dose of Atarax to help patient's sleep due to anxiety from Foley catheter present.  Patient appears overall well.  Discussed close follow-up with urology on Monday whether they already have an appointment.  The patient has been appropriately medically screened and/or stabilized in the ED. I have low suspicion for any other emergent medical condition which would require further screening, evaluation or treatment in the ED or require inpatient management.  Patient is hemodynamically stable and in no acute distress.  Patient able to ambulate in department prior to ED.  Evaluation does not show acute pathology that would require ongoing or additional emergent interventions while in the emergency department or further inpatient treatment.  I have discussed the diagnosis with the patient and answered all questions.  Pain is been managed while in the emergency department and patient has no further complaints prior to discharge.  Patient is comfortable with plan discussed in room and is stable for discharge at this time.  I have discussed strict return precautions for returning to the emergency department.  Patient was encouraged to follow-up with PCP/specialist refer to at discharge.     MDM Rules/Calculators/A&P                            Final Clinical Impression(s) / ED Diagnoses Final diagnoses:  Problem with Foley catheter, initial encounter [facility])    Rx / DC Orders ED Discharge Orders          Ordered    hydrOXYzine (ATARAX/VISTARIL) 25 MG tablet  Every 6 hours        06/02/21 1332             Rontavious Albright A, PA-C 06/02/21 1355    Arnaldo Natal, MD 06/02/21 1557

## 2021-06-02 NOTE — Discharge Instructions (Addendum)
Try the Atarax as needed for sleep  Follow-up with urology  Return for new or worsening symptoms

## 2021-06-02 NOTE — ED Notes (Signed)
Original urinary catheter remains in place, very patent and no kinks in tubing. Urinary drainage tubing/bag changed per ED PA orders, no complications were noted, stat lock to rt thigh applied, pt teaching provided to wife on how to empty new urinary drainage bag, wife returned demonstration.

## 2021-06-02 NOTE — ED Triage Notes (Signed)
Pt arrives with wife who states patient had foley placed here on 7/14. States she thinks the catheter isnt draining. Emptied it throughout the night and this morning, however hasnt put any out since. Has noticed some blood.

## 2021-06-02 NOTE — Telephone Encounter (Signed)
Previous RX Atarax not available at prior pharmacy. Resent to new pharmacy

## 2021-06-02 NOTE — ED Notes (Signed)
Foley catheter continues to drain urine

## 2021-06-03 ENCOUNTER — Encounter (HOSPITAL_BASED_OUTPATIENT_CLINIC_OR_DEPARTMENT_OTHER): Payer: Self-pay | Admitting: Emergency Medicine

## 2021-06-03 ENCOUNTER — Emergency Department (HOSPITAL_BASED_OUTPATIENT_CLINIC_OR_DEPARTMENT_OTHER)
Admission: EM | Admit: 2021-06-03 | Discharge: 2021-06-03 | Disposition: A | Discharge status: Home / Self Care | Payer: Medicare PPO | Attending: Emergency Medicine | Admitting: Emergency Medicine

## 2021-06-03 ENCOUNTER — Other Ambulatory Visit: Payer: Self-pay

## 2021-06-03 DIAGNOSIS — F0390 Unspecified dementia without behavioral disturbance: Secondary | ICD-10-CM | POA: Diagnosis not present

## 2021-06-03 DIAGNOSIS — T83091A Other mechanical complication of indwelling urethral catheter, initial encounter: Secondary | ICD-10-CM | POA: Insufficient documentation

## 2021-06-03 DIAGNOSIS — R339 Retention of urine, unspecified: Secondary | ICD-10-CM | POA: Diagnosis not present

## 2021-06-03 DIAGNOSIS — Y733 Surgical instruments, materials and gastroenterology and urology devices (including sutures) associated with adverse incidents: Secondary | ICD-10-CM | POA: Insufficient documentation

## 2021-06-03 DIAGNOSIS — Z96641 Presence of right artificial hip joint: Secondary | ICD-10-CM | POA: Insufficient documentation

## 2021-06-03 DIAGNOSIS — R338 Other retention of urine: Secondary | ICD-10-CM

## 2021-06-03 HISTORY — DX: Benign prostatic hyperplasia without lower urinary tract symptoms: N40.0

## 2021-06-03 NOTE — ED Notes (Signed)
Existing foley catheter began to drain after balloon was deflated. Catheter tubing cleaned and advanced. Foley able to drain 1450 mL after advancing. Pt sleeping comfortably.

## 2021-06-03 NOTE — ED Triage Notes (Signed)
Pt returns complaining of "bladder pain" that is new since his visit yesterday. His wife states that she felt the catheter was draining but that it had been pulling when he sits to use the bathroom. Pt is visibly uncomfortable, pacing.

## 2021-06-03 NOTE — ED Provider Notes (Signed)
[facility] DEPT [facility] Provider Note: Mike Spurling, MD, FACEP  CSN: [id number] MRN: [id number] ARRIVAL: 06/03/21 at [place]: Mike Pennington  Urinary Retention  Level 5 caveat: Dementia HISTORY OF PRESENT ILLNESS  06/03/21 5:51 AM Mike Pennington is a 70 y.o. male with dementia and an indwelling Foley catheter, last changed on 05/25/2021.  He was seen here yesterday evening for decreased urine output in his Foley catheter.  I saw myself about a week ago for similar complaint.  He has seen urology and has a follow-up appointment tomorrow.  At the time he was seen his wife noted his urine has been dark and she was concerned that he was bleeding.  She thinks the catheter may have gotten pulled due to a poorly placed leg bag resulting in pain in his penis.  The leg bag was replaced with a larger bedside bag and he was discharged home.  They return with about 6 hours of steadily increasing bladder pain and decreased urine output.  His pain has him grimacing.  Pain is worse when lying supine and improved when standing upright.  I did a quick bedside ultrasound and found his bladder to be grossly was distended with a significant mount of retained urine.  He is having no fever, chills, vomiting or diarrhea.   Past Medical History:  Diagnosis Date   Dementia ([place])    Enlarged prostate     Past Surgical History:  Procedure Laterality Date   TOTAL HIP ARTHROPLASTY Right     No family history on file.  Social History   Tobacco Use   Smoking status: Never   Smokeless tobacco: Never  Substance Use Topics   Alcohol use: Never   Drug use: Never    Prior to Admission medications   Medication Sig Start Date End Date Taking? Authorizing Provider  hydrOXYzine (ATARAX/VISTARIL) 25 MG tablet Take 1 tablet (25 mg total) by mouth every 6 (six) hours. 06/02/21   Henderly, Britni A, PA-C    Allergies Patient has no known allergies.   REVIEW OF SYSTEMS  Level 5  caveat   PHYSICAL EXAMINATION  Initial Vital Signs Blood pressure (!) 130/91, pulse 78, resp. rate 19, height 6' (1.829 m), weight 83.9 kg, SpO2 93 %.  Examination General: Well-developed, well-nourished male in no acute distress; appearance consistent with age of record HENT: normocephalic; atraumatic Eyes: pupils equal, round and reactive to light; extraocular muscles intact Neck: supple Heart: regular rate and rhythm Lungs: clear to auscultation bilaterally Abdomen: soft; distended, tender bladder; bowel sounds present Extremities: No deformity; prominent varicose veins of the lower extremities Neurologic: Awake, alert; motor function intact in all extremities and symmetric; no facial droop Skin: Warm and dry Psychiatric: Grimacing; minimally verbal   RESULTS  Summary of this visit's results, reviewed and interpreted by myself:   EKG Interpretation  Date/Time:    Ventricular Rate:    PR Interval:    QRS Duration:   QT Interval:    QTC Calculation:   R Axis:     Text Interpretation:         Laboratory Studies: Results for orders placed or performed during the hospital encounter of 06/02/21 (from the past 24 hour(s))  CBC with Differential     Status: Abnormal   Collection Time: 06/02/21 11:19 AM  Result Value Ref Range   WBC 6.1 4.0 - 10.5 K/uL   RBC 6.63 (H) 4.22 - 5.81 MIL/uL   Hemoglobin 17.7 (H) 13.0 - 17.0 g/dL  HCT 54.0 (H) 39.0 - 52.0 %   MCV 81.4 80.0 - 100.0 fL   MCH 26.7 26.0 - 34.0 pg   MCHC 32.8 30.0 - 36.0 g/dL   RDW 22.5 (H) 11.5 - 15.5 %   Platelets 266 150 - 400 K/uL   nRBC 0.0 0.0 - 0.2 %   Neutrophils Relative % 79 %   Neutro Abs 4.9 1.7 - 7.7 K/uL   Lymphocytes Relative 11 %   Lymphs Abs 0.7 0.7 - 4.0 K/uL   Monocytes Relative 8 %   Monocytes Absolute 0.5 0.1 - 1.0 K/uL   Eosinophils Relative 1 %   Eosinophils Absolute 0.1 0.0 - 0.5 K/uL   Basophils Relative 1 %   Basophils Absolute 0.0 0.0 - 0.1 K/uL   WBC Morphology >10% Reactive  Benign Lymphoctyes    RBC Morphology MORPHOLOGY UNREMARKABLE    Smear Review Normal platelet morphology    Immature Granulocytes 0 %   Abs Immature Granulocytes 0.02 0.00 - 0.07 K/uL  Basic metabolic panel     Status: Abnormal   Collection Time: 06/02/21 11:19 AM  Result Value Ref Range   Sodium 133 (L) 135 - 145 mmol/L   Potassium 4.6 3.5 - 5.1 mmol/L   Chloride 100 98 - 111 mmol/L   CO2 25 22 - 32 mmol/L   Glucose, Bld 122 (H) 70 - 99 mg/dL   BUN 19 8 - 23 mg/dL   Creatinine, Ser 1.11 0.61 - 1.24 mg/dL   Calcium 9.0 8.9 - 10.3 mg/dL   GFR, Estimated >60 >60 mL/min   Anion gap 8 5 - 15  Urinalysis, Routine w reflex microscopic Urine, Clean Catch     Status: Abnormal   Collection Time: 06/02/21 11:19 AM  Result Value Ref Range   Color, Urine YELLOW YELLOW   APPearance CLEAR CLEAR   Specific Gravity, Urine <1.005 (L) 1.005 - 1.030   pH 6.0 5.0 - 8.0   Glucose, UA NEGATIVE NEGATIVE mg/dL   Hgb urine dipstick LARGE (A) NEGATIVE   Bilirubin Urine NEGATIVE NEGATIVE   Ketones, ur NEGATIVE NEGATIVE mg/dL   Protein, ur NEGATIVE NEGATIVE mg/dL   Nitrite NEGATIVE NEGATIVE   Leukocytes,Ua TRACE (A) NEGATIVE  Urinalysis, Microscopic (reflex)     Status: Abnormal   Collection Time: 06/02/21 11:19 AM  Result Value Ref Range   RBC / HPF 11-20 0 - 5 RBC/hpf   WBC, UA 0-5 0 - 5 WBC/hpf   Bacteria, UA FEW (A) NONE SEEN   Squamous Epithelial / LPF 0-5 0 - 5   Imaging Studies: No results found.  ED COURSE and MDM  Nursing notes, initial and subsequent vitals signs, including pulse oximetry, reviewed and interpreted by myself.  Vitals:   06/03/21 0610 06/03/21 0612  BP: (!) 130/91   Pulse: 78   Resp: 19   TempSrc: Oral   SpO2: 93%   Weight:  83.9 kg  Height:  6' (1.829 m)   Medications - No data to display  6:30 AM Nursing staff deflated the Foley balloon, readjusted its position within the bladder then reinflated the balloon.  This resulted in immediate flow of urine from the  bladder and he voided about 1800 mL into his bag.  His bladder distention and discomfort were significantly improved.  As noted from the urinalysis done only a few hours ago he does not have evidence of urinary tract infection that would warrant an antibiotic.  Also as noted above he has an appointment with urology  tomorrow.  PROCEDURES  Procedures   ED DIAGNOSES     ICD-10-CM   1. Acute urinary retention  R33.8     2. Obstruction of Foley catheter, initial encounter [facility])  T83.091A          Shanon Rosser, MD 06/03/21 [phone]

## 2021-06-03 NOTE — ED Notes (Signed)
See EDP assessment 

## 2021-06-03 NOTE — ED Notes (Signed)
ED Provider at bedside. 

## 2021-06-12 ENCOUNTER — Emergency Department (HOSPITAL_BASED_OUTPATIENT_CLINIC_OR_DEPARTMENT_OTHER): Payer: Medicare PPO

## 2021-06-12 ENCOUNTER — Observation Stay (HOSPITAL_BASED_OUTPATIENT_CLINIC_OR_DEPARTMENT_OTHER)
Admission: EM | Admit: 2021-06-12 | Discharge: 2021-06-14 | Disposition: A | Discharge status: Home / Self Care | Payer: Medicare PPO | Attending: Internal Medicine | Admitting: Internal Medicine

## 2021-06-12 ENCOUNTER — Other Ambulatory Visit: Payer: Self-pay

## 2021-06-12 ENCOUNTER — Encounter (HOSPITAL_BASED_OUTPATIENT_CLINIC_OR_DEPARTMENT_OTHER): Payer: Self-pay | Admitting: Radiology

## 2021-06-12 DIAGNOSIS — N400 Enlarged prostate without lower urinary tract symptoms: Secondary | ICD-10-CM

## 2021-06-12 DIAGNOSIS — F0390 Unspecified dementia without behavioral disturbance: Secondary | ICD-10-CM

## 2021-06-12 DIAGNOSIS — K5900 Constipation, unspecified: Principal | ICD-10-CM

## 2021-06-12 DIAGNOSIS — Z96641 Presence of right artificial hip joint: Secondary | ICD-10-CM | POA: Diagnosis not present

## 2021-06-12 DIAGNOSIS — U071 COVID-19: Secondary | ICD-10-CM

## 2021-06-12 DIAGNOSIS — N390 Urinary tract infection, site not specified: Secondary | ICD-10-CM | POA: Diagnosis not present

## 2021-06-12 DIAGNOSIS — Z79899 Other long term (current) drug therapy: Secondary | ICD-10-CM | POA: Insufficient documentation

## 2021-06-12 DIAGNOSIS — B9620 Unspecified Escherichia coli [E. coli] as the cause of diseases classified elsewhere: Secondary | ICD-10-CM | POA: Diagnosis not present

## 2021-06-12 LAB — CBC WITH DIFFERENTIAL/PLATELET
Abs Immature Granulocytes: 0.06 10*3/uL (ref 0.00–0.07)
Basophils Absolute: 0.1 10*3/uL (ref 0.0–0.1)
Basophils Relative: 1 %
Eosinophils Absolute: 0.1 10*3/uL (ref 0.0–0.5)
Eosinophils Relative: 1 %
HCT: 57.6 % — ABNORMAL HIGH (ref 39.0–52.0)
Hemoglobin: 19.1 g/dL — ABNORMAL HIGH (ref 13.0–17.0)
Immature Granulocytes: 1 %
Lymphocytes Relative: 5 %
Lymphs Abs: 0.6 10*3/uL — ABNORMAL LOW (ref 0.7–4.0)
MCH: 27.4 pg (ref 26.0–34.0)
MCHC: 33.2 g/dL (ref 30.0–36.0)
MCV: 82.6 fL (ref 80.0–100.0)
Monocytes Absolute: 1.1 10*3/uL — ABNORMAL HIGH (ref 0.1–1.0)
Monocytes Relative: 9 %
Neutro Abs: 9.9 10*3/uL — ABNORMAL HIGH (ref 1.7–7.7)
Neutrophils Relative %: 83 %
Platelets: 366 10*3/uL (ref 150–400)
RBC: 6.97 MIL/uL — ABNORMAL HIGH (ref 4.22–5.81)
RDW: 23.1 % — ABNORMAL HIGH (ref 11.5–15.5)
WBC: 11.8 10*3/uL — ABNORMAL HIGH (ref 4.0–10.5)
nRBC: 0 % (ref 0.0–0.2)

## 2021-06-12 LAB — URINALYSIS, ROUTINE W REFLEX MICROSCOPIC
Bilirubin Urine: NEGATIVE
Glucose, UA: NEGATIVE mg/dL
Ketones, ur: NEGATIVE mg/dL
Nitrite: POSITIVE — AB
Protein, ur: NEGATIVE mg/dL
Specific Gravity, Urine: 1.01 (ref 1.005–1.030)
pH: 5.5 (ref 5.0–8.0)

## 2021-06-12 LAB — URINALYSIS, MICROSCOPIC (REFLEX)

## 2021-06-12 LAB — COMPREHENSIVE METABOLIC PANEL
ALT: 20 U/L (ref 0–44)
AST: 27 U/L (ref 15–41)
Albumin: 4.7 g/dL (ref 3.5–5.0)
Alkaline Phosphatase: 73 U/L (ref 38–126)
Anion gap: 11 (ref 5–15)
BUN: 20 mg/dL (ref 8–23)
CO2: 21 mmol/L — ABNORMAL LOW (ref 22–32)
Calcium: 9.1 mg/dL (ref 8.9–10.3)
Chloride: 101 mmol/L (ref 98–111)
Creatinine, Ser: 1.14 mg/dL (ref 0.61–1.24)
GFR, Estimated: >60 mL/min (ref >60)
Glucose, Bld: 113 mg/dL — ABNORMAL HIGH (ref 70–99)
Potassium: 4.2 mmol/L (ref 3.5–5.1)
Sodium: 133 mmol/L — ABNORMAL LOW (ref 135–145)
Total Bilirubin: 1.3 mg/dL — ABNORMAL HIGH (ref 0.3–1.2)
Total Protein: 8 g/dL (ref 6.5–8.1)

## 2021-06-12 LAB — RESP PANEL BY RT-PCR (FLU A&B, COVID) ARPGX2
Influenza A by PCR: NEGATIVE
Influenza B by PCR: NEGATIVE
SARS Coronavirus 2 by RT PCR: POSITIVE — AB

## 2021-06-12 MED ORDER — EPINEPHRINE 0.3 MG/0.3ML IJ SOAJ
0.3000 mg | Freq: Once | INTRAMUSCULAR | Status: AC | PRN
  Filled 2021-06-12: qty 0.6

## 2021-06-12 MED ORDER — MINERAL OIL RE ENEM: 1.0000 | ENEMA | Freq: Once | RECTAL | Status: DC

## 2021-06-12 MED ORDER — ZIPRASIDONE MESYLATE 20 MG IM SOLR
INTRAMUSCULAR | Status: AC
  Filled 2021-06-12: qty 20

## 2021-06-12 MED ORDER — SODIUM CHLORIDE 0.9 % IV BOLUS
1000.0000 mL | Freq: Once | INTRAVENOUS | Status: AC
  Administered 2021-06-12: 1000 mL via INTRAVENOUS

## 2021-06-12 MED ORDER — DIPHENHYDRAMINE HCL 50 MG/ML IJ SOLN
50.0000 mg | Freq: Once | INTRAMUSCULAR | Status: AC | PRN
  Administered 2021-06-13: 50 mg via INTRAVENOUS
  Filled 2021-06-12: qty 1

## 2021-06-12 MED ORDER — DROPERIDOL 2.5 MG/ML IJ SOLN
5.0000 mg | Freq: Once | INTRAMUSCULAR | Status: DC
  Filled 2021-06-12: qty 2

## 2021-06-12 MED ORDER — SODIUM CHLORIDE 0.9 % IV SOLN: INTRAVENOUS | Status: AC | PRN

## 2021-06-12 MED ORDER — DIPHENHYDRAMINE HCL 50 MG/ML IJ SOLN
25.0000 mg | Freq: Once | INTRAMUSCULAR | Status: AC
  Administered 2021-06-12: 25 mg via INTRAVENOUS
  Filled 2021-06-12: qty 1

## 2021-06-12 MED ORDER — DIPHENHYDRAMINE HCL 50 MG/ML IJ SOLN
25.0000 mg | Freq: Once | INTRAMUSCULAR | Status: AC
  Filled 2021-06-12: qty 1

## 2021-06-12 MED ORDER — DIPHENHYDRAMINE HCL 50 MG/ML IJ SOLN
INTRAMUSCULAR | Status: AC
  Administered 2021-06-12: 25 mg via INTRAVENOUS
  Filled 2021-06-12: qty 1

## 2021-06-12 MED ORDER — FENTANYL CITRATE (PF) 100 MCG/2ML IJ SOLN
100.0000 ug | Freq: Once | INTRAMUSCULAR | Status: AC
  Administered 2021-06-12: 100 ug via INTRAVENOUS
  Filled 2021-06-12: qty 2

## 2021-06-12 MED ORDER — IOHEXOL 300 MG/ML  SOLN
100.0000 mL | Freq: Once | INTRAMUSCULAR | Status: AC | PRN
  Administered 2021-06-12: 100 mL via INTRAVENOUS

## 2021-06-12 MED ORDER — SODIUM CHLORIDE 0.9 % IV SOLN
1.0000 g | INTRAVENOUS | Status: DC
  Administered 2021-06-12: 1 g via INTRAVENOUS
  Filled 2021-06-12: qty 10

## 2021-06-12 MED ORDER — MIDAZOLAM HCL 2 MG/2ML IJ SOLN
INTRAMUSCULAR | Status: AC
  Administered 2021-06-12: 1 mg via INTRAVENOUS
  Filled 2021-06-12: qty 2

## 2021-06-12 MED ORDER — FAMOTIDINE IN NACL 20-0.9 MG/50ML-% IV SOLN
20.0000 mg | Freq: Once | INTRAVENOUS | Status: AC | PRN
  Filled 2021-06-12: qty 50

## 2021-06-12 MED ORDER — SODIUM CHLORIDE 0.9 % IV SOLN: INTRAVENOUS | Status: DC | PRN

## 2021-06-12 MED ORDER — HYDROMORPHONE HCL 1 MG/ML IJ SOLN
1.0000 mg | Freq: Once | INTRAMUSCULAR | Status: AC
  Administered 2021-06-12: 1 mg via INTRAVENOUS
  Filled 2021-06-12: qty 1

## 2021-06-12 MED ORDER — MIDAZOLAM HCL 2 MG/2ML IJ SOLN
INTRAMUSCULAR | Status: AC
  Administered 2021-06-13: 2 mg via INTRAVENOUS
  Filled 2021-06-12: qty 2

## 2021-06-12 MED ORDER — ONDANSETRON HCL 4 MG/2ML IJ SOLN
4.0000 mg | Freq: Once | INTRAMUSCULAR | Status: DC
Start: 2021-06-12 — End: 2021-06-14
  Filled 2021-06-12 (×2): qty 2

## 2021-06-12 MED ORDER — DIPHENHYDRAMINE HCL 50 MG/ML IJ SOLN
12.5000 mg | Freq: Once | INTRAMUSCULAR | Status: AC
  Administered 2021-06-12: 12.5 mg via INTRAVENOUS
  Filled 2021-06-12: qty 1

## 2021-06-12 MED ORDER — ZIPRASIDONE MESYLATE 20 MG IM SOLR
20.0000 mg | Freq: Once | INTRAMUSCULAR | Status: AC
  Administered 2021-06-12: 20 mg via INTRAMUSCULAR

## 2021-06-12 MED ORDER — BEBTELOVIMAB 175 MG/2 ML IV (EUA)
175.0000 mg | Freq: Once | INTRAMUSCULAR | Status: AC
  Administered 2021-06-12: 175 mg via INTRAVENOUS
  Filled 2021-06-12: qty 2

## 2021-06-12 MED ORDER — ALBUTEROL SULFATE HFA 108 (90 BASE) MCG/ACT IN AERS
2.0000 | INHALATION_SPRAY | Freq: Once | RESPIRATORY_TRACT | Status: AC | PRN
  Filled 2021-06-12: qty 6.7

## 2021-06-12 MED ORDER — FLEET ENEMA 7-19 GM/118ML RE ENEM
1.0000 | ENEMA | Freq: Once | RECTAL | Status: AC
  Administered 2021-06-12: 1 via RECTAL
  Filled 2021-06-12: qty 1

## 2021-06-12 MED ORDER — HALOPERIDOL LACTATE 5 MG/ML IJ SOLN
2.0000 mg | Freq: Once | INTRAMUSCULAR | Status: AC
  Administered 2021-06-12: 2 mg via INTRAVENOUS
  Filled 2021-06-12: qty 1

## 2021-06-12 MED ORDER — MIDAZOLAM HCL 2 MG/2ML IJ SOLN: 1.0000 mg | Freq: Once | INTRAMUSCULAR | Status: AC

## 2021-06-12 MED ORDER — METHYLPREDNISOLONE SODIUM SUCC 125 MG IJ SOLR
125.0000 mg | Freq: Once | INTRAMUSCULAR | Status: AC | PRN
Start: 2021-06-12 — End: 2021-06-13

## 2021-06-12 MED ORDER — MIDAZOLAM HCL 2 MG/2ML IJ SOLN
2.0000 mg | Freq: Once | INTRAMUSCULAR | Status: AC
  Administered 2021-06-13: 2 mg via INTRAVENOUS

## 2021-06-12 MED ORDER — POLYETHYLENE GLYCOL 3350 17 G PO PACK
17.0000 g | PACK | Freq: Every day | ORAL | Status: DC
  Filled 2021-06-12: qty 1

## 2021-06-12 NOTE — ED Notes (Signed)
Patient transported to CT 

## 2021-06-12 NOTE — ED Notes (Addendum)
This RN, Marya Amsler EMT, Ermalinda Barrios, and Orlando Penner RN at bedside restraining patients arms and legs, patient becoming increasingly agitated and attempting to get out of bed, [employer] RN notified to have provider assess the situation and obtain further orders.  New orders received.

## 2021-06-12 NOTE — ED Notes (Signed)
Pt pacing room, pt id able to be redirected but needs constant redirection. Pt has produced small amt of stool

## 2021-06-12 NOTE — ED Notes (Signed)
Upon entering room patient on the end of the bed attempting to get out of bed. He became very combative then requiring multiple staff members to keep patient in bed.  IV in right AC has been pulled out.

## 2021-06-12 NOTE — Plan of Care (Signed)
[facility] will assume care on arrival to accepting facility. Until arrival, care as per EDP. However, [facility] available 24/7 for questions and assistance.  Nursing staff please page [facility] Admits and Consults ([phone]) as soon as the patient arrives the hospital.   

## 2021-06-12 NOTE — ED Notes (Addendum)
Patient asleep, wife at bedside.

## 2021-06-12 NOTE — ED Notes (Signed)
Remains combative still requiring multiple staff members.  IV in process of being restarted.

## 2021-06-12 NOTE — ED Provider Notes (Signed)
70 yo M with a cc of constipation and abdominal pain.  This is been going on since this morning.  I received him in signout from Dr. Regenia Skeeter.  Plan for lab work and a CT scan.  Unfortunately the patient has become increasingly agitated and was unable to hold still for a CT.  Reportedly had some significant improvement with pain medicine earlier.  Could be secondary to discomfort.  We will repeat a dose of pain medicine here.  Reassess.  Patient requiring multiple meds to adequately sedate for CT.  Patient CT scan with diffuse constipation without obvious concerning finding.  Of note there was mild hydronephrosis bilaterally but no obvious finding of nephrolithiasis or bladder outlet obstruction.  The urine does appear to be infected with nitrite positive and too numerous to count bacteria.  Will start on antibiotics.  With his altered mental status and urinary tract infection will discuss with hospitalist for admission.  Patient with some recurrent agitation concern for patient's safety again chemically sedated.  COVID test has returned positive.  We will give a one-time monoclonal antibody infusion.     Deno Etienne, DO 06/12/21 2249

## 2021-06-12 NOTE — ED Provider Notes (Signed)
[place] EMERGENCY DEPARTMENT Provider Note   CSN: [id number] Arrival date & time: 06/12/21  [id number]  LEVEL 5 CAVEAT - DEMENTIA   History Chief Complaint  Patient presents with   Constipation    Mike Pennington is a 70 y.o. male.  HPI 70 year old male presents with constipation.  This is happened before.  Was recently here at the end of June for similar episode and needed an enema.  The patient has been having small pellets of stools come out over the last couple days.  Wife gives him MiraLAX and stool softeners though typically gives him 1/2 cup of MiraLAX each day because more than that causes 3 or 4 soft stools.  Mostly seems to be having rectal pain/discomfort.  No significant abdominal pain.  No vomiting.  Past Medical History:  Diagnosis Date   Dementia ([place])    Enlarged prostate     There are no problems to display for this patient.   Past Surgical History:  Procedure Laterality Date   TOTAL HIP ARTHROPLASTY Right        No family history on file.  Social History   Tobacco Use   Smoking status: Never   Smokeless tobacco: Never  Substance Use Topics   Alcohol use: Never   Drug use: Never    Home Medications Prior to Admission medications   Medication Sig Start Date End Date Taking? Authorizing Provider  hydrOXYzine (ATARAX/VISTARIL) 25 MG tablet Take 1 tablet (25 mg total) by mouth every 6 (six) hours. 06/02/21   Henderly, Britni A, PA-C    Allergies    Patient has no known allergies.  Review of Systems   Review of Systems  Unable to perform ROS: Dementia   Physical Exam Updated Vital Signs BP (!) 139/98 (BP Location: Left Arm)   Pulse 95   Temp 98.5 F (36.9 C) (Oral)   Resp 18   Ht 6' (1.829 m)   Wt 83.9 kg   SpO2 98%   BMI 25.09 kg/m   Physical Exam Vitals and nursing note reviewed.  Constitutional:      Appearance: He is well-developed.  HENT:     Head: Normocephalic and atraumatic.     Right Ear: External ear normal.      Left Ear: External ear normal.     Nose: Nose normal.  Eyes:     General:        Right eye: No discharge.        Left eye: No discharge.  Cardiovascular:     Rate and Rhythm: Normal rate and regular rhythm.     Heart sounds: Normal heart sounds.  Pulmonary:     Effort: Pulmonary effort is normal.     Breath sounds: Normal breath sounds.  Abdominal:     Palpations: Abdomen is soft.     Tenderness: There is no abdominal tenderness.  Genitourinary:    Comments: There is a significant amount of stool that is not hard but is unable to be removed due to patient discomfort.  Musculoskeletal:     Cervical back: Neck supple.  Skin:    General: Skin is warm and dry.  Neurological:     Mental Status: He is alert.  Psychiatric:        Mood and Affect: Mood is not anxious.    ED Results / Procedures / Treatments   Labs (all labs ordered are listed, but only abnormal results are displayed) Labs Reviewed - No data to display  EKG  None  Radiology No results found.  Procedures Procedures   Medications Ordered in ED Medications  mineral oil enema 1 enema (has no administration in time range)    ED Course  I have reviewed the triage vital signs and the nursing notes.  Pertinent labs & imaging results that were available during my care of the patient were reviewed by me and considered in my medical decision making (see chart for details).    MDM Rules/Calculators/A&P                           Patient has stool in rectal vault but it's soft. Given enema, but only a small amount came out. I think this is at least partially due to him constantly pacing and needing to be redirected due to his dementia. Hard to get him to sit down to have a BM. Seems uncomfortable. Thus, will get labs, give fentanyl (for discomfort and to see if he can relax) and get CT. Care to Dr. Tyrone Nine.  Final Clinical Impression(s) / ED Diagnoses Final diagnoses:  None    Rx / DC Orders ED Discharge Orders      None        Sherwood Gambler, MD 06/12/21 1511

## 2021-06-12 NOTE — ED Notes (Signed)
Per EDP Regenia Skeeter, verified with Pharmacist Apolonio Schneiders to administer Fleet enema

## 2021-06-12 NOTE — ED Notes (Signed)
PT altered at this time. Needs constant redirection, Attempting to void post enema. Vitals to be obtained upon return to bed

## 2021-06-12 NOTE — ED Notes (Signed)
Patient still being physically restrained by this RN, Marya Amsler EMT, Ermalinda Barrios, Orlando Penner RN.  Patient's wife at bedside attempting to help calm patient down.  Patient verbalizes that he will relax and comply multiple times, then attempts to get back out of bed again.  Legrand Como RN called to room to notify provider of continued agitation of patient.

## 2021-06-12 NOTE — ED Notes (Signed)
Pt has AMS, normal baseline

## 2021-06-12 NOTE — ED Triage Notes (Signed)
Pt c/o constipation the past few days. Tried miralax and stool softener without relief. States small "pellets" came out yesterday. C/o rectal pressure, denies abdominal pain.

## 2021-06-12 NOTE — ED Notes (Signed)
Pt in bed, placed on 5 lead after admin of fentanyl. Pt resting with eye closed. Pt family member given crackers and gingerale

## 2021-06-12 NOTE — ED Notes (Signed)
Pts restraints not being used at this time, pt sleeping

## 2021-06-12 NOTE — ED Notes (Signed)
Patient became agitated and pulled out his IV, catheter intact.   Patient attempted to get out of bed, attempted to redirect patient to obtain compliance.  This RN and Marya Amsler EMT took patient via stretcher to CT to attempt CT scan, after 15 minutes of being unable to get patient still enough for scan, it was decided to take patient back to his room and attempt scan latter.

## 2021-06-12 NOTE — ED Notes (Addendum)
Pt continues to be agitated. EDP Notified. Pt restraints applied due to patients at risk of harming self and staff. Pt is pulling at IV, pulling at Foley catheter. Pt unable to verbalize needs. Pts wife at bedside with staff attempting to  Calm patient. Pts wife understands and acknowledges need for restraints. EDP notified multiple times regarding behavior of patient. Pt continued to be combative after medication administered. Pt was not able to be redirected with other calming measures. Pt was fighting staff, attempting to kick, headbutt and pinch staff.

## 2021-06-13 ENCOUNTER — Encounter (HOSPITAL_COMMUNITY): Payer: Self-pay | Admitting: Internal Medicine

## 2021-06-13 DIAGNOSIS — U071 COVID-19: Secondary | ICD-10-CM

## 2021-06-13 DIAGNOSIS — K5900 Constipation, unspecified: Secondary | ICD-10-CM

## 2021-06-13 DIAGNOSIS — F0390 Unspecified dementia without behavioral disturbance: Secondary | ICD-10-CM

## 2021-06-13 DIAGNOSIS — N400 Enlarged prostate without lower urinary tract symptoms: Secondary | ICD-10-CM

## 2021-06-13 LAB — C-REACTIVE PROTEIN: CRP: 5.4 mg/dL — ABNORMAL HIGH (ref <1.0)

## 2021-06-13 LAB — CBC
HCT: 52.5 % — ABNORMAL HIGH (ref 39.0–52.0)
Hemoglobin: 17.5 g/dL — ABNORMAL HIGH (ref 13.0–17.0)
MCH: 27.8 pg (ref 26.0–34.0)
MCHC: 33.3 g/dL (ref 30.0–36.0)
MCV: 83.3 fL (ref 80.0–100.0)
Platelets: 309 10*3/uL (ref 150–400)
RBC: 6.3 MIL/uL — ABNORMAL HIGH (ref 4.22–5.81)
RDW: 22.5 % — ABNORMAL HIGH (ref 11.5–15.5)
WBC: 9.1 10*3/uL (ref 4.0–10.5)
nRBC: 0 % (ref 0.0–0.2)

## 2021-06-13 LAB — COMPREHENSIVE METABOLIC PANEL
ALT: 20 U/L (ref 0–44)
AST: 29 U/L (ref 15–41)
Albumin: 3.5 g/dL (ref 3.5–5.0)
Alkaline Phosphatase: 57 U/L (ref 38–126)
Anion gap: 11 (ref 5–15)
BUN: 15 mg/dL (ref 8–23)
CO2: 25 mmol/L (ref 22–32)
Calcium: 9.5 mg/dL (ref 8.9–10.3)
Chloride: 103 mmol/L (ref 98–111)
Creatinine, Ser: 1.13 mg/dL (ref 0.61–1.24)
GFR, Estimated: >60 mL/min (ref >60)
Glucose, Bld: 87 mg/dL (ref 70–99)
Potassium: 3.9 mmol/L (ref 3.5–5.1)
Sodium: 139 mmol/L (ref 135–145)
Total Bilirubin: 1.2 mg/dL (ref 0.3–1.2)
Total Protein: 6.2 g/dL — ABNORMAL LOW (ref 6.5–8.1)

## 2021-06-13 LAB — HIV ANTIBODY (ROUTINE TESTING W REFLEX): HIV Screen 4th Generation wRfx: NONREACTIVE

## 2021-06-13 LAB — D-DIMER, QUANTITATIVE: D-Dimer, Quant: 0.83 ug/mL-FEU — ABNORMAL HIGH (ref 0.00–0.50)

## 2021-06-13 MED ORDER — TAMSULOSIN HCL 0.4 MG PO CAPS: 0.8000 mg | ORAL_CAPSULE | Freq: Every day | ORAL | Status: DC

## 2021-06-13 MED ORDER — LACTATED RINGERS IV SOLN: INTRAVENOUS | Status: DC

## 2021-06-13 MED ORDER — HALOPERIDOL LACTATE 5 MG/ML IJ SOLN
2.0000 mg | Freq: Four times a day (QID) | INTRAMUSCULAR | Status: DC | PRN
  Administered 2021-06-13: 2 mg via INTRAVENOUS
  Filled 2021-06-13: qty 1

## 2021-06-13 MED ORDER — HALOPERIDOL LACTATE 5 MG/ML IJ SOLN
2.0000 mg | Freq: Once | INTRAMUSCULAR | Status: AC | PRN
  Administered 2021-06-13: 2 mg via INTRAVENOUS
  Filled 2021-06-13: qty 1

## 2021-06-13 MED ORDER — NIRMATRELVIR/RITONAVIR (PAXLOVID)TABLET
3.0000 | ORAL_TABLET | Freq: Two times a day (BID) | ORAL | Status: DC
  Administered 2021-06-13 (×2): 3 via ORAL
  Filled 2021-06-13: qty 30

## 2021-06-13 MED ORDER — ACETAMINOPHEN 650 MG RE SUPP: 650.0000 mg | Freq: Four times a day (QID) | RECTAL | Status: DC | PRN

## 2021-06-13 MED ORDER — FLEET ENEMA 7-19 GM/118ML RE ENEM: 1.0000 | ENEMA | Freq: Once | RECTAL | Status: DC | PRN

## 2021-06-13 MED ORDER — NIRMATRELVIR/RITONAVIR (PAXLOVID)TABLET
3.0000 | ORAL_TABLET | Freq: Two times a day (BID) | ORAL | Status: DC
  Filled 2021-06-13: qty 30

## 2021-06-13 MED ORDER — ACETAMINOPHEN 325 MG PO TABS: 650.0000 mg | ORAL_TABLET | Freq: Four times a day (QID) | ORAL | Status: DC | PRN

## 2021-06-13 MED ORDER — BISACODYL 10 MG RE SUPP
10.0000 mg | Freq: Once | RECTAL | Status: AC
  Administered 2021-06-13: 10 mg via RECTAL
  Filled 2021-06-13: qty 1

## 2021-06-13 MED ORDER — POLYETHYLENE GLYCOL 3350 17 G PO PACK
17.0000 g | PACK | Freq: Every day | ORAL | Status: DC
  Administered 2021-06-13 – 2021-06-14 (×2): 17 g via ORAL
  Filled 2021-06-13 (×2): qty 1

## 2021-06-13 MED ORDER — QUETIAPINE FUMARATE 50 MG PO TABS
50.0000 mg | ORAL_TABLET | Freq: Every day | ORAL | Status: DC
  Administered 2021-06-13: 50 mg via ORAL
  Filled 2021-06-13: qty 1

## 2021-06-13 MED ORDER — POLYETHYLENE GLYCOL 3350 17 G PO PACK: 17.0000 g | PACK | Freq: Two times a day (BID) | ORAL | Status: DC

## 2021-06-13 MED ORDER — MAGNESIUM HYDROXIDE 400 MG/5ML PO SUSP: 30.0000 mL | Freq: Every day | ORAL | Status: DC

## 2021-06-13 MED ORDER — HALOPERIDOL LACTATE 5 MG/ML IJ SOLN
2.0000 mg | Freq: Once | INTRAMUSCULAR | Status: DC
  Filled 2021-06-13: qty 1

## 2021-06-13 MED ORDER — FINASTERIDE 5 MG PO TABS
5.0000 mg | ORAL_TABLET | Freq: Every day | ORAL | Status: DC
  Administered 2021-06-13 – 2021-06-14 (×2): 5 mg via ORAL
  Filled 2021-06-13 (×2): qty 1

## 2021-06-13 MED ORDER — SORBITOL 70 % SOLN
400.0000 mL | TOPICAL_OIL | Freq: Once | ORAL | Status: DC
  Filled 2021-06-13: qty 120

## 2021-06-13 MED ORDER — CHLORHEXIDINE GLUCONATE CLOTH 2 % EX PADS
6.0000 | MEDICATED_PAD | Freq: Every day | CUTANEOUS | Status: DC
  Administered 2021-06-13 – 2021-06-14 (×2): 6 via TOPICAL

## 2021-06-13 MED ORDER — ENOXAPARIN SODIUM 40 MG/0.4ML IJ SOSY
40.0000 mg | PREFILLED_SYRINGE | Freq: Every day | INTRAMUSCULAR | Status: DC
  Administered 2021-06-13 – 2021-06-14 (×2): 40 mg via SUBCUTANEOUS
  Filled 2021-06-13 (×2): qty 0.4

## 2021-06-13 MED ORDER — TAMSULOSIN HCL 0.4 MG PO CAPS
0.4000 mg | ORAL_CAPSULE | Freq: Every day | ORAL | Status: DC
  Administered 2021-06-13: 0.4 mg via ORAL
  Filled 2021-06-13: qty 1

## 2021-06-13 MED ORDER — SODIUM CHLORIDE 0.9 % IV SOLN
1.0000 g | INTRAVENOUS | Status: DC
  Administered 2021-06-13: 1 g via INTRAVENOUS
  Filled 2021-06-13: qty 10

## 2021-06-13 NOTE — Plan of Care (Signed)
  Problem: Safety: Goal: Non-violent Restraint(s) Outcome: Progressing   Problem: Education: Goal: Knowledge of General Education information will improve Description: Including pain rating scale, medication(s)/side effects and non-pharmacologic comfort measures Outcome: Progressing   Problem: Health Behavior/Discharge Planning: Goal: Ability to manage health-related needs will improve Outcome: Progressing   Problem: Clinical Measurements: Goal: Ability to maintain clinical measurements within normal limits will improve Outcome: Progressing Goal: Will remain free from infection Outcome: Progressing Goal: Diagnostic test results will improve Outcome: Progressing Goal: Respiratory complications will improve Outcome: Progressing Goal: Cardiovascular complication will be avoided Outcome: Progressing   Problem: Activity: Goal: Risk for activity intolerance will decrease Outcome: Progressing   Problem: Nutrition: Goal: Adequate nutrition will be maintained Outcome: Progressing   Problem: Coping: Goal: Level of anxiety will decrease Outcome: Progressing   Problem: Elimination: Goal: Will not experience complications related to bowel motility Outcome: Progressing Goal: Will not experience complications related to urinary retention Outcome: Progressing   Problem: Pain Managment: Goal: General experience of comfort will improve Outcome: Progressing   Problem: Safety: Goal: Ability to remain free from injury will improve Outcome: Progressing   Problem: Skin Integrity: Goal: Risk for impaired skin integrity will decrease Outcome: Progressing   Problem: Education: Goal: Knowledge of risk factors and measures for prevention of condition will improve Outcome: Progressing   Problem: Coping: Goal: Psychosocial and spiritual needs will be supported Outcome: Progressing   Problem: Respiratory: Goal: Will maintain a patent airway Outcome: Progressing Goal: Complications  related to the disease process, condition or treatment will be avoided or minimized Outcome: Progressing

## 2021-06-13 NOTE — Plan of Care (Signed)

## 2021-06-13 NOTE — H&P (Signed)
History and Physical    Mike Pennington [id number] DOB: [birth year] DOA: 06/12/2021  PCP: Rudene Anda, MD   Patient coming from: Home via [facility] ER  Chief Complaint: Constipation, agitation.  HPI: Mike Pennington is a 70 y.o. male with medical history significant for Dementia, BPH with recent urinary retention.  He presented to [facility] emergency room for evaluation of constipation.  The wife states that he has complained over the last 2 to 3 days of not being on to have bowel movements and when he did have one yesterday it was very small according to the wife.  He came more agitated while at home which is not like him.  She reports he did not have any slurred speech, drooping face, weakness of extremities, syncope.  He has dementia and is unable to provide good history himself.  Reports that he had some urinary retention 3 weeks ago and was seen by urology and had a Foley catheter placed.  Foley catheter was removed after few days but he continued to have trouble urinating so was replaced and is still present.  Reports he has not had any fever, chills, cough, shortness of breath.   ED Course: In the emergency room he was quite agitated and required Geodon to help calm him down.  He initially was resistant to having his abdomen evaluated.  He was given enemas with good stool production.  CT scan also revealed mild hydronephrosis bilaterally with no nephrolithiasis or bladder outlet obstruction noted.  Urinalysis was positive for urinary tract infection.  He was placed on Rocephin for antibiotic coverage.  COVID swab came back positive.  He has been asymptomatic for COVID per the wife.  He has been fully vaccinated and has received a booster according to the wife.  He was given 1 dose of monoclonal antibody infusion in the emergency room.  Hospitalist service was asked to admit for further management  Review of Systems:  Unable to obtain review of systems secondary to dementia  Past  Medical History:  Diagnosis Date   Dementia ([place])    Enlarged prostate     Past Surgical History:  Procedure Laterality Date   TOTAL HIP ARTHROPLASTY Right     Social History  reports that he has never smoked. He has never used smokeless tobacco. He reports that he does not drink alcohol and does not use drugs.  Allergies  Allergen Reactions   Ativan [Lorazepam] Other (See Comments)    Per family member, aggression with ativan    History reviewed. No pertinent family history.   Prior to Admission medications   Medication Sig Start Date End Date Taking? Authorizing Provider  hydrOXYzine (ATARAX/VISTARIL) 25 MG tablet Take 1 tablet (25 mg total) by mouth every 6 (six) hours. 06/02/21   Henderly, Britni A, PA-C    Physical Exam: Vitals:   06/12/21 2230 06/12/21 2300 06/13/21 0101 06/13/21 0127  BP: (!) 167/111 127/85  127/88  Pulse:    76  Resp: '16 16  14  '$ Temp:   (P) 98.1 F (36.7 C) 98.1 F (36.7 C)  TempSrc:    Oral  SpO2: 96% 96%  96%  Weight:      Height:        Constitutional: NAD, calm, comfortable Vitals:   06/12/21 2230 06/12/21 2300 06/13/21 0101 06/13/21 0127  BP: (!) 167/111 127/85  127/88  Pulse:    76  Resp: '16 16  14  '$ Temp:   (P) 98.1 F (36.7  C) 98.1 F (36.7 C)  TempSrc:    Oral  SpO2: 96% 96%  96%  Weight:      Height:       General: WDWN, sleeping.  Awakens to voice and opens eyes but only provides minimal verbal responses.  Alert and oriented to self Eyes: PERRL, conjunctivae normal.  Sclera nonicteric HENT:  [place]/AT, external ears normal.  Nares patent without epistasis.  Mucous membranes are moist.  Neck: Soft, normal range of motion, supple, no masses, Trachea midline Respiratory: clear to auscultation bilaterally, no wheezing, no crackles. Normal respiratory effort. No accessory muscle use.  Cardiovascular: Regular rate and rhythm, no murmurs / rubs / gallops. No extremity edema.  Abdomen: Soft, no tenderness, nondistended, no rebound  or guarding.  No masses palpated. Bowel sounds normoactive GU:  Foley in place Musculoskeletal: FROM. no cyanosis. No joint deformity upper and lower extremities. Normal muscle tone.  Skin: Warm, dry, intact no rashes, lesions, ulcers. No induration Neurologic: Moves all 4 extremity spontaneously.  Normal speech.      Labs on Admission: I have personally reviewed following labs and imaging studies  CBC: Recent Labs  Lab 06/12/21 1432  WBC 11.8*  NEUTROABS 9.9*  HGB 19.1*  HCT 57.6*  MCV 82.6  PLT [id number]    Basic Metabolic Panel: Recent Labs  Lab 06/12/21 1432  NA 133*  K 4.2  CL 101  CO2 21*  GLUCOSE 113*  BUN 20  CREATININE 1.14  CALCIUM 9.1    GFR: Estimated Creatinine Clearance: 67.1 mL/min (by C-G formula based on SCr of 1.14 mg/dL).  Liver Function Tests: Recent Labs  Lab 06/12/21 1432  AST 27  ALT 20  ALKPHOS 73  BILITOT 1.3*  PROT 8.0  ALBUMIN 4.7    Urine analysis:    Component Value Date/Time   COLORURINE YELLOW 06/12/2021 1730   APPEARANCEUR HAZY (A) 06/12/2021 1730   LABSPEC 1.010 06/12/2021 1730   PHURINE 5.5 06/12/2021 1730   GLUCOSEU NEGATIVE 06/12/2021 1730   HGBUR LARGE (A) 06/12/2021 1730   BILIRUBINUR NEGATIVE 06/12/2021 1730   KETONESUR NEGATIVE 06/12/2021 1730   PROTEINUR NEGATIVE 06/12/2021 1730   UROBILINOGEN 0.2 09/23/2008 1430   NITRITE POSITIVE (A) 06/12/2021 1730   LEUKOCYTESUR LARGE (A) 06/12/2021 1730    Radiological Exams on Admission: CT ABDOMEN PELVIS W CONTRAST  Result Date: 06/12/2021 CLINICAL DATA:  Constipation.  Bowel obstruction suspected. EXAM: CT ABDOMEN AND PELVIS WITH CONTRAST TECHNIQUE: Multidetector CT imaging of the abdomen and pelvis was performed using the standard protocol following bolus administration of intravenous contrast. CONTRAST:  <See Chart> OMNIPAQUE IOHEXOL 300 MG/ML  SOLN COMPARISON:  CT 02/08/2021 FINDINGS: Lower chest: No acute findings allowing for motion artifact. Hepatobiliary: Artifact  from patient's arm overlying the upper abdomen with streak artifact from watch. Few subcentimeter hypodensities in the liver not significantly changed from prior exam, allowing for streak and motion artifact. Unremarkable gallbladder. Pancreas: No ductal dilatation or inflammation. Spleen: Normal in size without focal abnormality. Adrenals/Urinary Tract: No adrenal nodule. There is mild bilateral hydroureteronephrosis. The ureter is dilated to the bladder insertion. No visualized stone or cause for obstruction. There is a 15 mm nonobstructing stone in the lower left kidney. Bilateral renal cysts including parapelvic cysts in the left kidney. No perinephric edema. Symmetric excretion on delayed phase imaging. Foley catheter decompresses the urinary bladder. Stomach/Bowel: There is a large volume of stool throughout the entire colon with colonic tortuosity. The rectum is distended with rectal diameter of 8 cm. Mild  rectal wall thickening and perirectal fat stranding. There is no pneumatosis or perforation. Normal appendix. There is no small bowel dilatation or evidence of obstruction. The stomach is decompressed. Vascular/Lymphatic: Aortic atherosclerosis and tortuosity. No aortic aneurysm. Patent portal vein. No portal venous or mesenteric gas. No bulky abdominopelvic adenopathy. Reproductive: Prominent prostate spanning 5.9 cm. Other: No free air, free fluid, or intra-abdominal fluid collection. Musculoskeletal: Scoliosis with multilevel degenerative change in the spine. Right hip arthroplasty. 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 IMPRESSION: 1. Large volume of stool throughout the entire colon with colonic tortuosity, consistent with constipation. The rectum is distended with rectal diameter of 8 cm. Mild rectal wall thickening and perirectal fat stranding, suspicious for fecal impaction. 2. Mild bilateral hydroureteronephrosis to the bladder  insertion. No visualized stone or cause for obstruction. Nonobstructing stone in the lower left kidney. B 3. Prominent prostate spanning 5.9 cm. Aortic Atherosclerosis (ICD10-I70.0). Electronically Signed   By: Keith Rake M.D.   On: 06/12/2021 18:54    EKG: Independently reviewed.  EKG shows normal sinus rhythm with right bundle branch block and left anterior fascicular block.  No acute ST elevation or depression.  QTc 454  Assessment/Plan Principal Problem:   COVID-19 virus infection Mr. Sacchetti is found to have COVID-19 infection incidentally.  He has no respiratory symptoms and is not requiring any supplemental oxygen. He was given monoclonal antibody in the emergency room. Will treat with course of Paxlovid. This is discussed with the pharmacist who feels this would be the most prudent course of treatment for this patient.  Active Problems:   UTI (urinary tract infection) Treat with Rocephin for antibiotic coverage.  Urine cultures been obtained and will be monitored.    Constipation Patient with significant constipation on CT scan.  He underwent enema in the emergency room x2 and has had good results per the wife.    BPH (benign prostatic hyperplasia) Chronic.  Patient has had Foley catheter placed for the last 3 weeks secondary to urinary retention according to the wife. Is on Flomax and Proscar which will be continued    Dementia  Chronic     DVT prophylaxis: Lovenox for DVT prophylaxis Code Status:   Full code Family Communication:  Diagnosis and plan discussed with patient's wife who is at the bedside.  She verbalized understanding agrees with plan.  Further recommendations to follow as clinical indicated Disposition Plan:   Patient is from:  Home  Anticipated DC to:  Home  Anticipated DC date:  Anticipate 2 midnight stay in the hospital  Admission status:  Inpatient   Yevonne Aline Arsalan Brisbin MD [facility]  How to contact the [facility] Attending or Consulting  provider [place] or covering provider during after hours [place], for this patient?   Check the care team in [facility] and look for a) attending/consulting [facility] provider listed and b) the [facility] team listed Log into [email or web address] and use [facility] universal password to access. If you do not have the password, please contact the hospital operator. Locate the [facility] provider you are looking for under [facility] and page to a number that you can be directly reached. If you still have difficulty reaching the provider, please page the [facility] (Director on Call) for the Hospitalists listed on amion for assistance.  06/13/2021, 1:54 AM

## 2021-06-13 NOTE — ED Notes (Signed)
Becomes agitated and combative when manipulated and changed.

## 2021-06-13 NOTE — ED Notes (Signed)
Versed given to keep patient more comfortable for transport to [facility].

## 2021-06-13 NOTE — Progress Notes (Signed)
PROGRESS NOTE    Mike Pennington  [id number] DOB: [birth year] DOA: 06/12/2021 PCP: Rudene Anda, MD   Chief Complaint  Patient presents with   Constipation    Brief Narrative:   This is a no charge note as patient was admitted earlier today by Dr. Kipp Brood, patient was seen and examined, chart, imaging and labs were reviewed.Marland Kitchen  HPI: Mike Pennington is a 70 y.o. male with medical history significant for Dementia, BPH with recent urinary retention.  He presented to [facility] emergency room for evaluation of constipation.  The wife states that he has complained over the last 2 to 3 days of not being on to have bowel movements and when he did have one yesterday it was very small according to the wife.  He came more agitated while at home which is not like him.  She reports he did not have any slurred speech, drooping face, weakness of extremities, syncope.  He has dementia and is unable to provide good history himself.  Reports that he had some urinary retention 3 weeks ago and was seen by urology and had a Foley catheter placed.  Foley catheter was removed after few days but he continued to have trouble urinating so was replaced and is still present.  Reports he has not had any fever, chills, cough, shortness of breath.    ED Course: In the emergency room he was quite agitated and required Geodon to help calm him down.  He initially was resistant to having his abdomen evaluated.  He was given enemas with good stool production.  CT scan also revealed mild hydronephrosis bilaterally with no nephrolithiasis or bladder outlet obstruction noted.  Urinalysis was positive for urinary tract infection.  He was placed on Rocephin for antibiotic coverage.  COVID swab came back positive.  He has been asymptomatic for COVID per the wife.  He has been fully vaccinated and has received a booster according to the wife.  He was given 1 dose of monoclonal antibody infusion in the emergency room.  Hospitalist  service was asked to admit for further management     Assessment & Plan:   Principal Problem:   COVID-19 virus infection Active Problems:   UTI (urinary tract infection)   Constipation   BPH (benign prostatic hyperplasia)   Dementia ([place])    COVID-19 virus infection Mr. Pluim is found to have COVID-19 infection incidentally.  He has no respiratory symptoms and is not requiring any supplemental oxygen. He was given monoclonal antibody in the emergency room. Will treat with course of Paxlovid. This is discussed with the pharmacist who feels this would be the most prudent course of treatment for this patient.   Active Problems:   UTI (urinary tract infection) Treat with Rocephin for antibiotic coverage.  Urine cultures been obtained and will be monitored.     Constipation Patient with significant constipation on CT scan.  He underwent enema in the emergency room x2 and has had good results per the wife.     BPH (benign prostatic hyperplasia) Chronic.  Patient has had Foley catheter placed for the last 3 weeks secondary to urinary retention according to the wife. Is on Flomax and Proscar which will be continued     Dementia Chronic -Seroquel, started recently by PCP.      DVT prophylaxis: Lovenox Code Status: Full Family Communication: wife at bedside Disposition:   Status is: Inpatient  Remains inpatient appropriate because:IV treatments appropriate due to intensity of illness or  inability to take PO  Dispo: The patient is from: Home              Anticipated d/c is to: Home              Patient currently is not medically stable to d/c.   Difficult to place patient No       Consultants:  D/W Urology by phone  Subjective:  Is unable to provide any complaints, wife at bedside report he had multiple bowel movements today, had restless night  Objective: Vitals:   06/13/21 0127 06/13/21 0440 06/13/21 0730 06/13/21 1255  BP: 127/88 (!) 130/96 124/77 (!) 114/91   Pulse: 76 79 84   Resp: '14 13 12   '$ Temp: 98.1 F (36.7 C) 98 F (36.7 C) 97.7 F (36.5 C) 97.7 F (36.5 C)  TempSrc: Oral Axillary Oral Oral  SpO2: 96% 94%    Weight:      Height:        Intake/Output Summary (Last 24 hours) at 06/13/2021 1650 Last data filed at 06/13/2021 [id number] Gross per 24 hour  Intake 1272.46 ml  Output 2325 ml  Net -1052.54 ml   Filed Weights   06/12/21 1008  Weight: 83.9 kg    Examination:  Awake, confused, unable to answer any questions appropriately or following the command, restless Symmetrical Chest wall movement, Good air movement bilaterally, CTAB RRR,No Gallops,Rubs or new Murmurs, No Parasternal Heave +ve B.Sounds, Abd Soft, No tenderness, No rebound - guarding or rigidity. No Cyanosis, Clubbing or edema, No new Rash or bruise   Foley catheter present   Data Reviewed: I have personally reviewed following labs and imaging studies  CBC: Recent Labs  Lab 06/12/21 1432 06/13/21 0333  WBC 11.8* 9.1  NEUTROABS 9.9*  --   HGB 19.1* 17.5*  HCT 57.6* 52.5*  MCV 82.6 83.3  PLT 366 [id number]    Basic Metabolic Panel: Recent Labs  Lab 06/12/21 1432 06/13/21 0333  NA 133* 139  K 4.2 3.9  CL 101 103  CO2 21* 25  GLUCOSE 113* 87  BUN 20 15  CREATININE 1.14 1.13  CALCIUM 9.1 9.5    GFR: Estimated Creatinine Clearance: 67.7 mL/min (by C-G formula based on SCr of 1.13 mg/dL).  Liver Function Tests: Recent Labs  Lab 06/12/21 1432 06/13/21 0333  AST 27 29  ALT 20 20  ALKPHOS 73 57  BILITOT 1.3* 1.2  PROT 8.0 6.2*  ALBUMIN 4.7 3.5    CBG: No results for input(s): GLUCAP in the last 168 hours.   Recent Results (from the past 240 hour(s))  Resp Panel by RT-PCR (Flu A&B, Covid) Nasopharyngeal Swab     Status: Abnormal   Collection Time: 06/12/21  9:15 PM   Specimen: Nasopharyngeal Swab; Nasopharyngeal(NP) swabs in vial transport medium  Result Value Ref Range Status   SARS Coronavirus 2 by RT PCR POSITIVE (A) NEGATIVE Final     Comment: RESULT CALLED TO, READ BACK BY AND VERIFIED WITH: L. ADKINS,CHARGE RN 2218 06/12/2021 T. TYSOR (NOTE) SARS-CoV-2 target nucleic acids are DETECTED.  The SARS-CoV-2 RNA is generally detectable in upper respiratory specimens during the acute phase of infection. Positive results are indicative of the presence of the identified virus, but do not rule out bacterial infection or co-infection with other pathogens not detected by the test. Clinical correlation with patient history and other diagnostic information is necessary to determine patient infection status. The expected result is Negative.  Fact Sheet for Patients: EntrepreneurPulse.com.au  Fact Sheet for Healthcare Providers: IncredibleEmployment.be  This test is not yet approved or cleared by the [place] FDA and  has been authorized for detection and/or diagnosis of SARS-CoV-2 by FDA under an Emergency Use Authorization (EUA).  This EUA will remain in effect (meaning this  test can be used) for the duration of  the COVID-19 declaration under Section 564(b)(1) of the Act, 21 U.S.C. section 360bbb-3(b)(1), unless the authorization is terminated or revoked sooner.     Influenza A by PCR NEGATIVE NEGATIVE Final   Influenza B by PCR NEGATIVE NEGATIVE Final    Comment: (NOTE) The Xpert Xpress SARS-CoV-2/FLU/RSV plus assay is intended as an aid in the diagnosis of influenza from Nasopharyngeal swab specimens and should not be used as a sole basis for treatment. Nasal washings and aspirates are unacceptable for Xpert Xpress SARS-CoV-2/FLU/RSV testing.  Fact Sheet for Patients: EntrepreneurPulse.com.au  Fact Sheet for Healthcare Providers: IncredibleEmployment.be  This test is not yet approved or cleared by the [place] FDA and has been authorized for detection and/or diagnosis of SARS-CoV-2 by FDA under an Emergency Use Authorization (EUA).  This EUA will remain in effect (meaning this test can be used) for the duration of the COVID-19 declaration under Section 564(b)(1) of the Act, 21 U.S.C. section 360bbb-3(b)(1), unless the authorization is terminated or revoked.  Performed at [facility], [address] [place], [place] [address]          Radiology Studies: CT ABDOMEN PELVIS W CONTRAST  Result Date: 06/12/2021 CLINICAL DATA:  Constipation.  Bowel obstruction suspected. EXAM: CT ABDOMEN AND PELVIS WITH CONTRAST TECHNIQUE: Multidetector CT imaging of the abdomen and pelvis was performed using the standard protocol following bolus administration of intravenous contrast. CONTRAST:  <See Chart> OMNIPAQUE IOHEXOL 300 MG/ML  SOLN COMPARISON:  CT 02/08/2021 FINDINGS: Lower chest: No acute findings allowing for motion artifact. Hepatobiliary: Artifact from patient's arm overlying the upper abdomen with streak artifact from watch. Few subcentimeter hypodensities in the liver not significantly changed from prior exam, allowing for streak and motion artifact. Unremarkable gallbladder. Pancreas: No ductal dilatation or inflammation. Spleen: Normal in size without focal abnormality. Adrenals/Urinary Tract: No adrenal nodule. There is mild bilateral hydroureteronephrosis. The ureter is dilated to the bladder insertion. No visualized stone or cause for obstruction. There is a 15 mm nonobstructing stone in the lower left kidney. Bilateral renal cysts including parapelvic cysts in the left kidney. No perinephric edema. Symmetric excretion on delayed phase imaging. Foley catheter decompresses the urinary bladder. Stomach/Bowel: There is a large volume of stool throughout the entire colon with colonic tortuosity. The rectum is distended with rectal diameter of 8 cm. Mild rectal wall thickening and perirectal fat stranding. There is no pneumatosis or perforation. Normal appendix. There is no small bowel dilatation or evidence of  obstruction. The stomach is decompressed. Vascular/Lymphatic: Aortic atherosclerosis and tortuosity. No aortic aneurysm. Patent portal vein. No portal venous or mesenteric gas. No bulky abdominopelvic adenopathy. Reproductive: Prominent prostate spanning 5.9 cm. Other: No free air, free fluid, or intra-abdominal fluid collection. Musculoskeletal: Scoliosis with multilevel degenerative change in the spine. Right hip arthroplasty. 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 IMPRESSION: 1. Large volume of stool throughout the entire colon with colonic tortuosity, consistent with constipation. The rectum is distended with rectal diameter of 8 cm. Mild rectal wall thickening and perirectal fat stranding, suspicious for fecal impaction. 2. Mild bilateral hydroureteronephrosis to the bladder insertion.  No visualized stone or cause for obstruction. Nonobstructing stone in the lower left kidney. B 3. Prominent prostate spanning 5.9 cm. Aortic Atherosclerosis (ICD10-I70.0). Electronically Signed   By: Keith Rake M.D.   On: 06/12/2021 18:54        Scheduled Meds:  Chlorhexidine Gluconate Cloth  6 each Topical Daily   enoxaparin (LOVENOX) injection  40 mg Subcutaneous Daily   finasteride  5 mg Oral Daily   nirmatrelvir/ritonavir EUA  3 tablet Oral BID   ondansetron (ZOFRAN) IV  4 mg Intravenous Once   polyethylene glycol  17 g Oral Daily   tamsulosin  0.4 mg Oral Daily   Continuous Infusions:  sodium chloride Stopped (06/12/21 2328)   sodium chloride     cefTRIAXone (ROCEPHIN)  IV     famotidine (PEPCID) IV     lactated ringers 50 mL/hr at 06/13/21 1042     LOS: 0 days      Phillips Climes, MD [facility]   To contact the attending provider between 7A-7P or the covering provider during after hours 7P-7A, please log into the web site [email or web address] and access using universal [place] password for that web site. If  you do not have the password, please call the hospital operator.  06/13/2021, 4:50 PM

## 2021-06-13 NOTE — TOC Transition Note (Signed)
Transition of Care [facility]) - CM/SW Discharge Note   Patient Details  Name: Mike Pennington MRN: [id number] Date of Birth: [birth year]  [facility] CM/SW Contact:  Carles Collet, RN Phone Number: 06/13/2021, 3:31 PM   Clinical Narrative:    Spoke to patient's wife to discuss dispo planning.  Patient would benefot from [facility] RN for medication management and assistance with foley care. Discussed [facility] provider ratings. Alvis Lemmings accepted referral    Final next level of care: Home w Home Health Services Barriers to Discharge: No Barriers Identified   Patient Goals and CMS Choice Patient states their goals for this hospitalization and ongoing recovery are:: to go home   Choice offered to / list presented to : Spouse  Discharge Placement                       Discharge Plan and Services                DME Arranged: N/A         HH Arranged: RN [place] Agency: [place] Date [facility] Agency Contacted: 06/13/21 Time [place]: [id number] Representative spoke with at [place]: [place] ([place]) Interventions     Readmission Risk Interventions No flowsheet data found.

## 2021-06-13 NOTE — ED Notes (Signed)
Became alert enough to tolerate po's and was asking for water.  Tolerated well.

## 2021-06-14 ENCOUNTER — Other Ambulatory Visit (HOSPITAL_COMMUNITY): Payer: Self-pay

## 2021-06-14 DIAGNOSIS — K5900 Constipation, unspecified: Secondary | ICD-10-CM | POA: Diagnosis not present

## 2021-06-14 DIAGNOSIS — T83511S Infection and inflammatory reaction due to indwelling urethral catheter, sequela: Secondary | ICD-10-CM

## 2021-06-14 DIAGNOSIS — N390 Urinary tract infection, site not specified: Secondary | ICD-10-CM | POA: Diagnosis not present

## 2021-06-14 DIAGNOSIS — U071 COVID-19: Secondary | ICD-10-CM | POA: Diagnosis not present

## 2021-06-14 MED ORDER — MILK OF MAGNESIA 7.75 % PO SUSP: 30.0000 mL | Freq: Every day | ORAL | 3 refills | Status: DC | PRN

## 2021-06-14 MED ORDER — DOCUSATE SODIUM 100 MG PO CAPS: 100.0000 mg | ORAL_CAPSULE | Freq: Two times a day (BID) | ORAL | 0 refills | Status: AC

## 2021-06-14 MED ORDER — POLYETHYLENE GLYCOL 3350 17 G PO PACK: 17.0000 g | PACK | Freq: Every day | ORAL | 0 refills | Status: AC

## 2021-06-14 MED ORDER — QUETIAPINE FUMARATE 25 MG PO TABS
ORAL_TABLET | ORAL | 0 refills | Status: AC
  Filled 2021-06-14: qty 90, 30d supply, fill #0

## 2021-06-14 MED ORDER — TAMSULOSIN HCL 0.4 MG PO CAPS: 0.8000 mg | ORAL_CAPSULE | Freq: Every day | ORAL | Status: DC

## 2021-06-14 MED ORDER — CEPHALEXIN 500 MG PO CAPS
500.0000 mg | ORAL_CAPSULE | Freq: Four times a day (QID) | ORAL | 0 refills | Status: AC
  Filled 2021-06-14: qty 32, 8d supply, fill #0

## 2021-06-14 MED ORDER — QUETIAPINE FUMARATE 25 MG PO TABS
25.0000 mg | ORAL_TABLET | ORAL | Status: AC
  Administered 2021-06-14: 25 mg via ORAL
  Filled 2021-06-14: qty 1

## 2021-06-14 MED ORDER — METAMUCIL SMOOTH TEXTURE 58.6 % PO POWD: 1.0000 | Freq: Every day | ORAL | 12 refills | Status: DC

## 2021-06-14 NOTE — Care Management Obs Status (Signed)
[place] NOTIFICATION   Patient Details  Name: Mike Pennington MRN: [id number] Date of Birth: 1951-07-28   Medicare Observation Status Notification Given:  Yes    Marilu Favre, RN 06/14/2021, 12:03 PM

## 2021-06-14 NOTE — Discharge Instructions (Addendum)
     Please take Colace 100 mg oral twice daily, Metamucil fiber once daily, increase his fluid intake while on Metamucil, and add miralx daily if needed if no bowel movement by day 2, if patient remains significantly constipated, then use milk of magnesia by day 3 if needed. -Continue Seroquel 50 mg oral at bedtime, and start 25 mg oral daily, daily Seroquel can be stopped patient is more restful and less agitated daytime.

## 2021-06-14 NOTE — Discharge Summary (Addendum)
Physician Discharge Summary  Mike [facility] [id number] DOB: 09-02-1951 DOA: 06/12/2021  PCP: Mike Anda, MD  Admit date: 06/12/2021 Discharge date: 06/14/2021  Admitted From: Home Disposition:  Home  Recommendations for Outpatient Follow-up:  Follow up with PCP in 1-2 weeks Please obtain BMP/CBC in one week Is following the final results of urine cultures And to follow-up with urology for voiding trial on prescheduled appointment 8/10.  Home Health: YES Equipment/Devices: none  Discharge Condition:Stable CODE STATUS:FULL Diet recommendation: Regular   Brief/Interim Summary:    COVID-19 virus infection Mike [facility]ton is found to have COVID-19 infection incidentally.  He has no respiratory symptoms and is not requiring any supplemental oxygen. He was given monoclonal antibody in the emergency room.  And he was treated with with paxlovid during [facility]     UTI , caused by Foley catheter. -Patient was treated with IV Rocephin during [facility] stay, urine culture growing E. coli, sensitivities are pending at time of discharge, patient to continue with Keflex on discharge till he is seen by his urologist for a voiding trial.  .    Constipation Patient with significant constipation on CT scan.  He underwent enema in the emergency room x2 and has had good results per the wife. -I have discussed with wife, recommendation to continue with Colace 100 mg twice daily, and Metamucil daily, and to add MiraLAX daily if needed if no improvement.  And she can use milk of magnesia only as needed.     BPH (benign prostatic hyperplasia) Chronic.  Patient has had Foley catheter placed for the last 3 weeks secondary to urinary retention according to the wife.  Currently he failed voiding trial at Dr. Alinda [facility] office 7/25, where his Flomax has been doubled. -Patient was kept on Flomax and Proscar during [facility] stay -Have discussed with urology Dr. Diona [facility], recommendation is to continue with the original  plan of voiding trial on 8/10, and to continue with antibiotic coverage till that date were he will  seen at [facility].     Dementia with [facility] delirium Chronic -Patient is with chronic dementia, early onset, but he is restless, agitated intermittently during [facility] stay requiring as needed haloperidol, was recently started on Seroquel 25 at bedtime, this was increased to 50 mg at bedtime, as well he was kept on Seroquel 25 mg oral daytime, I have discussed with wife, once patient back in familiar setting like his house, hopefully we can discontinue the daytime, especially she reports most of his baseline symptoms at nighttime.     Discharge Diagnoses:  Principal Problem:   COVID-19 virus infection Active Problems:   UTI (urinary tract infection)   Constipation   BPH (benign prostatic hyperplasia)   Dementia Mike [facility])    Discharge Instructions  Discharge Instructions     Diet - low sodium heart healthy   Complete by: As directed    Discharge instructions   Complete by: As directed    Please take Colace 100 mg oral twice daily, Metamucil fiber once daily, increase his fluid intake while on Metamucil, and add miralax  daily if needed if no bowel movement by day 2, if patient remains significantly constipated, then use milk of magnesia by day 3 if needed. -Continue Seroquel 50 mg oral at bedtime, and start 25 mg oral daily, daily Seroquel can be stopped patient is more restful and less agitated daytime.   Increase activity slowly   Complete by: As directed       Allergies as of 06/14/2021  Reactions   Ambien [zolpidem] Other (See Comments)   Erratic behavior   Ativan [lorazepam] Other (See Comments)   Per family member, aggression with ativan        Medication List     STOP taking these medications    hydrOXYzine 25 MG tablet Commonly known as: ATARAX/VISTARIL       TAKE these medications    cephALEXin 500 MG capsule Commonly known as: KEFLEX Take 1  capsule (500 mg total) by mouth 4 (four) times daily for 8 days.   docusate sodium 100 MG capsule Commonly known as: Colace Take 1 capsule (100 mg total) by mouth 2 (two) times daily.   finasteride 5 MG tablet Commonly known as: PROSCAR Take 5 mg by mouth daily.   Metamucil Smooth Texture 58.6 % powder Generic drug: psyllium Take 1 packet by mouth daily.   Milk of Magnesia 400 MG/5ML suspension Generic drug: magnesium hydroxide Take 30 mLs by mouth daily as needed for moderate constipation. Please take if no  bowel movement by day 3 while using the other regimen   OVER THE COUNTER MEDICATION Take 1 capsule by mouth daily. Daily bowel capsule   polyethylene glycol 17 g packet Commonly known as: MIRALAX / GLYCOLAX Take 17 g by mouth daily. Please start if patient having constipation, and hold for diarrhea. Start taking on: June 15, 2021   QUEtiapine 25 MG tablet Commonly known as: SEROQUEL Please take 25 mg (1 Tablet) oral daily in AM, and 50 mg (2 Tablets) oral at bedtime. What changed:  how much to take how to take this when to take this additional instructions   tamsulosin 0.4 MG Caps capsule Commonly known as: FLOMAX Take 0.4 mg by mouth 2 (two) times daily.   traMADol 50 MG tablet Commonly known as: ULTRAM Take 50 mg by mouth 2 (two) times daily as needed for pain.        Follow-up Information     [facility] Follow up.   Specialty: Home Health Services Why: for home health services. they will call you to schedule a visit to come out to your huse in the next 1-2 days Contact information: [address] [place] [address] [phone]                Allergies  Allergen Reactions   Ambien [Zolpidem] Other (See Comments)    Erratic behavior   Ativan [Lorazepam] Other (See Comments)    Per family member, aggression with ativan    Consultations: None   Procedures/Studies: CT ABDOMEN PELVIS W CONTRAST  Result  Date: 06/12/2021 CLINICAL DATA:  Constipation.  Bowel obstruction suspected. EXAM: CT ABDOMEN AND PELVIS WITH CONTRAST TECHNIQUE: Multidetector CT imaging of the abdomen and pelvis was performed using the standard protocol following bolus administration of intravenous contrast. CONTRAST:  <See Chart> OMNIPAQUE IOHEXOL 300 MG/ML  SOLN COMPARISON:  CT 02/08/2021 FINDINGS: Lower chest: No acute findings allowing for motion artifact. Hepatobiliary: Artifact from patient's arm overlying the upper abdomen with streak artifact from watch. Few subcentimeter hypodensities in the liver not significantly changed from prior exam, allowing for streak and motion artifact. Unremarkable gallbladder. Pancreas: No ductal dilatation or inflammation. Spleen: Normal in size without focal abnormality. Adrenals/Urinary Tract: No adrenal nodule. There is mild bilateral hydroureteronephrosis. The ureter is dilated to the bladder insertion. No visualized stone or cause for obstruction. There is a 15 mm nonobstructing stone in the lower left kidney. Bilateral renal cysts including parapelvic cysts in the  left kidney. No perinephric edema. Symmetric excretion on delayed phase imaging. Foley catheter decompresses the urinary bladder. Stomach/Bowel: There is a large volume of stool throughout the entire colon with colonic tortuosity. The rectum is distended with rectal diameter of 8 cm. Mild rectal wall thickening and perirectal fat stranding. There is no pneumatosis or perforation. Normal appendix. There is no small bowel dilatation or evidence of obstruction. The stomach is decompressed. Vascular/Lymphatic: Aortic atherosclerosis and tortuosity. No aortic aneurysm. Patent portal vein. No portal venous or mesenteric gas. No bulky abdominopelvic adenopathy. Reproductive: Prominent prostate spanning 5.9 cm. Other: No free air, free fluid, or intra-abdominal fluid collection. Musculoskeletal: Scoliosis with multilevel degenerative change in the  spine. Right hip arthroplasty. 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 IMPRESSION: 1. Large volume of stool throughout the entire colon with colonic tortuosity, consistent with constipation. The rectum is distended with rectal diameter of 8 cm. Mild rectal wall thickening and perirectal fat stranding, suspicious for fecal impaction. 2. Mild bilateral hydroureteronephrosis to the bladder insertion. No visualized stone or cause for obstruction. Nonobstructing stone in the lower left kidney. B 3. Prominent prostate spanning 5.9 cm. Aortic Atherosclerosis (ICD10-I70.0). Electronically Signed   By: Keith Rake M.D.   On: 06/12/2021 18:54     Subjective:  Patient himself unable to provide any complaints, wife at bedside reports has been having good bowel movement with minimal incontinence  Discharge Exam: Vitals:   06/14/21 0737 06/14/21 1152  BP: 119/86 107/72  Pulse: 66 91  Resp: 20 20  Temp: 97.8 F (36.6 C) 98 F (36.7 C)  SpO2:     Vitals:   06/14/21 0008 06/14/21 0446 06/14/21 0737 06/14/21 1152  BP: (!) 145/99 (!) 160/92 119/86 107/72  Pulse: 85 76 66 91  Resp: '20 20 20 20  '$ Temp: 98 F (36.7 C) 97.9 F (36.6 C) 97.8 F (36.6 C) 98 F (36.7 C)  TempSrc: Oral Axillary Axillary Oral  SpO2: 96% 93%    Weight:      Height:        General: Pt is alert, awake, significantly impaired cognition and insight  cardiovascular: RRR, S1/S2 +, no rubs, no gallops Respiratory: CTA bilaterally, no wheezing, no rhonchi Abdominal: Soft, NT, ND, bowel sounds + Extremities: no edema, no cyanosis Foley+    The results of significant diagnostics from this hospitalization (including imaging, microbiology, ancillary and laboratory) are listed below for reference.     Microbiology: Recent Results (from the past 240 hour(s))  Urine Culture     Status: Abnormal (Preliminary result)   Collection Time: 06/12/21  5:30 PM    Specimen: Urine, Catheterized  Result Value Ref Range Status   Specimen Description   Final    URINE, CATHETERIZED Performed at [facility], [address] [place], [place] [address]    Special Requests   Final    NONE Performed at [facility], [address] [place], [place] [address]    Culture (A)  Final    >=100,000 COLONIES/mL ESCHERICHIA COLI SUSCEPTIBILITIES TO FOLLOW Performed at [facility], [place] [address], [place], [place] [address]    Report Status PENDING  Incomplete  Resp Panel by RT-PCR (Flu A&B, Covid) Nasopharyngeal Swab     Status: Abnormal   Collection Time: 06/12/21  9:15 PM   Specimen: Nasopharyngeal Swab; Nasopharyngeal(NP) swabs in vial transport medium  Result Value Ref Range Status  SARS Coronavirus 2 by RT PCR POSITIVE (A) NEGATIVE Final    Comment: RESULT CALLED TO, READ BACK BY AND VERIFIED WITH: L. ADKINS,CHARGE RN 2218 06/12/2021 T. TYSOR (NOTE) SARS-CoV-2 target nucleic acids are DETECTED.  The SARS-CoV-2 RNA is generally detectable in upper respiratory specimens during the acute phase of infection. Positive results are indicative of the presence of the identified virus, but do not rule out bacterial infection or co-infection with other pathogens not detected by the test. Clinical correlation with patient history and other diagnostic information is necessary to determine patient infection status. The expected result is Negative.  Fact Sheet for Patients: EntrepreneurPulse.com.au  Fact Sheet for Healthcare Providers: IncredibleEmployment.be  This test is not yet approved or cleared by the [place] FDA and  has been authorized for detection and/or diagnosis of SARS-CoV-2 by FDA under an Emergency Use Authorization (EUA).  This EUA will remain in effect (meaning this  test can be used) for the duration of  the COVID-19 declaration under Section 564(b)(1) of the Act,  21 U.S.C. section 360bbb-3(b)(1), unless the authorization is terminated or revoked sooner.     Influenza A by PCR NEGATIVE NEGATIVE Final   Influenza B by PCR NEGATIVE NEGATIVE Final    Comment: (NOTE) The Xpert Xpress SARS-CoV-2/FLU/RSV plus assay is intended as an aid in the diagnosis of influenza from Nasopharyngeal swab specimens and should not be used as a sole basis for treatment. Nasal washings and aspirates are unacceptable for Xpert Xpress SARS-CoV-2/FLU/RSV testing.  Fact Sheet for Patients: EntrepreneurPulse.com.au  Fact Sheet for Healthcare Providers: IncredibleEmployment.be  This test is not yet approved or cleared by the [place] FDA and has been authorized for detection and/or diagnosis of SARS-CoV-2 by FDA under an Emergency Use Authorization (EUA). This EUA will remain in effect (meaning this test can be used) for the duration of the COVID-19 declaration under Section 564(b)(1) of the Act, 21 U.S.C. section 360bbb-3(b)(1), unless the authorization is terminated or revoked.  Performed at [facility], [address] [place], [place] [address]      Labs: BNP (last 3 results) No results for input(s): BNP in the last 8760 hours. Basic Metabolic Panel: Recent Labs  Lab 06/12/21 1432 06/13/21 0333  NA 133* 139  K 4.2 3.9  CL 101 103  CO2 21* 25  GLUCOSE 113* 87  BUN 20 15  CREATININE 1.14 1.13  CALCIUM 9.1 9.5   Liver Function Tests: Recent Labs  Lab 06/12/21 1432 06/13/21 0333  AST 27 29  ALT 20 20  ALKPHOS 73 57  BILITOT 1.3* 1.2  PROT 8.0 6.2*  ALBUMIN 4.7 3.5   No results for input(s): LIPASE, AMYLASE in the last 168 hours. No results for input(s): AMMONIA in the last 168 hours. CBC: Recent Labs  Lab 06/12/21 1432 06/13/21 0333  WBC 11.8* 9.1  NEUTROABS 9.9*  --   HGB 19.1* 17.5*  HCT 57.6* 52.5*  MCV 82.6 83.3  PLT 366 309   Cardiac Enzymes: No results for input(s):  CKTOTAL, CKMB, CKMBINDEX, TROPONINI in the last 168 hours. BNP: Invalid input(s): POCBNP CBG: No results for input(s): GLUCAP in the last 168 hours. D-Dimer Recent Labs    06/13/21 0333  DDIMER 0.83*   Hgb A1c No results for input(s): HGBA1C in the last 72 hours. Lipid Profile No results for input(s): CHOL, HDL, LDLCALC, TRIG, CHOLHDL, LDLDIRECT in the last 72 hours. Thyroid function studies No results for input(s): TSH, T4TOTAL, T3FREE, THYROIDAB in the last  72 hours.  Invalid input(s): FREET3 Anemia work up No results for input(s): VITAMINB12, FOLATE, FERRITIN, TIBC, IRON, RETICCTPCT in the last 72 hours. Urinalysis    Component Value Date/Time   COLORURINE YELLOW 06/12/2021 1730   APPEARANCEUR HAZY (A) 06/12/2021 1730   LABSPEC 1.010 06/12/2021 1730   PHURINE 5.5 06/12/2021 1730   GLUCOSEU NEGATIVE 06/12/2021 1730   HGBUR LARGE (A) 06/12/2021 1730   BILIRUBINUR NEGATIVE 06/12/2021 1730   KETONESUR NEGATIVE 06/12/2021 1730   PROTEINUR NEGATIVE 06/12/2021 1730   UROBILINOGEN 0.2 09/23/2008 1430   NITRITE POSITIVE (A) 06/12/2021 1730   LEUKOCYTESUR LARGE (A) 06/12/2021 1730   Sepsis Labs Invalid input(s): PROCALCITONIN,  WBC,  LACTICIDVEN Microbiology Recent Results (from the past 240 hour(s))  Urine Culture     Status: Abnormal (Preliminary result)   Collection Time: 06/12/21  5:30 PM   Specimen: Urine, Catheterized  Result Value Ref Range Status   Specimen Description   Final    URINE, CATHETERIZED Performed at [facility], [address] [place], [place] [address]    Special Requests   Final    NONE Performed at [facility], [address] [place], [place] [address]    Culture (A)  Final    >=100,000 COLONIES/mL ESCHERICHIA COLI SUSCEPTIBILITIES TO FOLLOW Performed at [facility], [place] [address] [place], [place] [address]    Report Status PENDING  Incomplete  Resp Panel by RT-PCR (Flu A&B, Covid) Nasopharyngeal Swab      Status: Abnormal   Collection Time: 06/12/21  9:15 PM   Specimen: Nasopharyngeal Swab; Nasopharyngeal(NP) swabs in vial transport medium  Result Value Ref Range Status   SARS Coronavirus 2 by RT PCR POSITIVE (A) NEGATIVE Final    Comment: RESULT CALLED TO, READ BACK BY AND VERIFIED WITH: L. ADKINS,CHARGE RN 2218 06/12/2021 T. TYSOR (NOTE) SARS-CoV-2 target nucleic acids are DETECTED.  The SARS-CoV-2 RNA is generally detectable in upper respiratory specimens during the acute phase of infection. Positive results are indicative of the presence of the identified virus, but do not rule out bacterial infection or co-infection with other pathogens not detected by the test. Clinical correlation with patient history and other diagnostic information is necessary to determine patient infection status. The expected result is Negative.  Fact Sheet for Patients: EntrepreneurPulse.com.au  Fact Sheet for Healthcare Providers: IncredibleEmployment.be  This test is not yet approved or cleared by the [place] FDA and  has been authorized for detection and/or diagnosis of SARS-CoV-2 by FDA under an Emergency Use Authorization (EUA).  This EUA will remain in effect (meaning this  test can be used) for the duration of  the COVID-19 declaration under Section 564(b)(1) of the Act, 21 U.S.C. section 360bbb-3(b)(1), unless the authorization is terminated or revoked sooner.     Influenza A by PCR NEGATIVE NEGATIVE Final   Influenza B by PCR NEGATIVE NEGATIVE Final    Comment: (NOTE) The Xpert Xpress SARS-CoV-2/FLU/RSV plus assay is intended as an aid in the diagnosis of influenza from Nasopharyngeal swab specimens and should not be used as a sole basis for treatment. Nasal washings and aspirates are unacceptable for Xpert Xpress SARS-CoV-2/FLU/RSV testing.  Fact Sheet for Patients: EntrepreneurPulse.com.au  Fact Sheet for Healthcare  Providers: IncredibleEmployment.be  This test is not yet approved or cleared by the [place] FDA and has been authorized for detection and/or diagnosis of SARS-CoV-2 by FDA under an Emergency Use Authorization (EUA). This EUA will remain in effect (meaning this  test can be used) for the duration of the COVID-19 declaration under Section 564(b)(1) of the Act, 21 U.S.C. section 360bbb-3(b)(1), unless the authorization is terminated or revoked.  Performed at [facility], [address] [place], [place] [address]      Time coordinating discharge: Over 30 minutes  SIGNED:   Phillips Climes, MD  [facility] 06/14/2021, 3:29 PM Pager   If 7PM-7AM, please contact night-coverage [email or web address] Password [email or web address]

## 2021-06-14 NOTE — Care Management CC44 (Signed)
Condition Code 44 Documentation Completed  Patient Details  Name: NATION KACKLEY MRN: [id number] Date of Birth: [birth year]   Condition Code 44 given:  Yes Patient signature on Condition Code 44 notice:  Yes Documentation of 2 MD's agreement:  Yes Code 44 added to claim:  Yes    Marilu Favre, RN 06/14/2021, 12:03 PM

## 2021-06-14 NOTE — Care Management (Signed)
[facility] aware discharge today

## 2021-06-15 LAB — URINE CULTURE: Culture: >=100000 — AB

## 2021-06-25 ENCOUNTER — Encounter (HOSPITAL_BASED_OUTPATIENT_CLINIC_OR_DEPARTMENT_OTHER): Payer: Self-pay

## 2021-06-25 ENCOUNTER — Emergency Department (HOSPITAL_BASED_OUTPATIENT_CLINIC_OR_DEPARTMENT_OTHER)
Admission: EM | Admit: 2021-06-25 | Discharge: 2021-06-25 | Disposition: A | Discharge status: Home / Self Care | Payer: Medicare PPO | Attending: Emergency Medicine | Admitting: Emergency Medicine

## 2021-06-25 ENCOUNTER — Other Ambulatory Visit: Payer: Self-pay

## 2021-06-25 DIAGNOSIS — F0390 Unspecified dementia without behavioral disturbance: Secondary | ICD-10-CM | POA: Diagnosis not present

## 2021-06-25 DIAGNOSIS — Z96641 Presence of right artificial hip joint: Secondary | ICD-10-CM | POA: Insufficient documentation

## 2021-06-25 DIAGNOSIS — R319 Hematuria, unspecified: Secondary | ICD-10-CM

## 2021-06-25 DIAGNOSIS — N4889 Other specified disorders of penis: Secondary | ICD-10-CM | POA: Insufficient documentation

## 2021-06-25 MED ORDER — LIDOCAINE HCL URETHRAL/MUCOSAL 2 % EX GEL
1.0000 "application " | Freq: Once | CUTANEOUS | Status: AC
  Administered 2021-06-25: 1 via URETHRAL
  Filled 2021-06-25: qty 11

## 2021-06-25 NOTE — ED Provider Notes (Signed)
[facility] Emergency Department Provider Note MRN:  [id number]  Arrival date & time: 06/25/21     Chief Complaint   Hematuria   History of Present Illness   Mike [facility] is a 70 y.o. year-old male with a history of dementia, enlarged prostate presenting to the ED with chief complaint of hematuria.  Blood noted in the Foley bag this evening.  Here for evaluation.  Patient has been having some penile pain related to tugging of the Foley catheter.  Wife explains that the Foley was changed a week ago but there does not seem to be much slack between the Foley at the tip of the penis and the leg attachment.  Patient denies any complaints but has dementia.  I was unable to obtain an accurate HPI, PMH, or ROS due to the patient's dementia.  Level 5 caveat.  Review of Systems  Positive for hematuria.  Patient's Health History    Past Medical History:  Diagnosis Date   Dementia ([place])    Enlarged prostate     Past Surgical History:  Procedure Laterality Date   TOTAL HIP ARTHROPLASTY Right     No family history on file.  Social History   Socioeconomic History   Marital status: Married    Spouse name: Not on file   Number of children: Not on file   Years of education: Not on file   Highest education level: Not on file  Occupational History   Not on file  Tobacco Use   Smoking status: Never   Smokeless tobacco: Never  Substance and Sexual Activity   Alcohol use: Never   Drug use: Never   Sexual activity: Not on file  Other Topics Concern   Not on file  Social History Narrative   Not on file   Social Determinants of Health   Financial Resource Strain: Not on file  Food Insecurity: Not on file  Transportation Needs: Not on file  Physical Activity: Not on file  Stress: Not on file  Social Connections: Not on file  Intimate Partner Violence: Not on file     Physical Exam   Vitals:   06/25/21 0138  BP: (!) 143/103  Pulse: 91  Resp:  18  Temp: 97.8 F (36.6 C)  SpO2: 99%    CONSTITUTIONAL: Well-appearing, NAD NEURO:  Alert and oriented x 3, no focal deficits EYES:  eyes equal and reactive ENT/NECK:  no LAD, no JVD CARDIO: Regular rate, well-perfused, normal S1 and S2 PULM:  CTAB no wheezing or rhonchi GI/GU:  normal bowel sounds, non-distended, non-tender MSK/SPINE:  No gross deformities, no edema SKIN:  no rash, atraumatic PSYCH:  Appropriate speech and behavior  *Additional and/or pertinent findings included in MDM below  Diagnostic and Interventional Summary    EKG Interpretation  Date/Time:    Ventricular Rate:    PR Interval:    QRS Duration:   QT Interval:    QTC Calculation:   R Axis:     Text Interpretation:         Labs Reviewed  URINE CULTURE    No orders to display    Medications  lidocaine (XYLOCAINE) 2 % jelly 1 application (1 application Urethral Given 06/25/21 0246)     Procedures  /  Critical Care Procedures  ED Course and Medical Decision Making  I have reviewed the triage vital signs, the nursing notes, and pertinent available records from the EMR.  Listed above are laboratory and imaging tests that  I personally ordered, reviewed, and interpreted and then considered in my medical decision making (see below for details).  Patient is well-appearing sitting comfortably, no fever, abdomen soft and nontender, Foley bag with some dark urine but no obvious blood.  There does seem to be a short distance between the tip of the penis and the leg bag attachment.  Could be causing some tugging and irritation and lead to bleeding.  Nothing to suggest emergent process, Foley catheter is replaced, urine sent for culture, appropriate for discharge.       Barth Kirks. [place] Small, [place] [email or web address]  Final Clinical Impressions(s) / ED Diagnoses     ICD-10-CM   1. Hematuria, unspecified type  R31.9       ED Discharge Orders      None        Discharge Instructions Discussed with and Provided to Patient:     Discharge Instructions      You were evaluated in the Emergency Department and after careful evaluation, we did not find any emergent condition requiring admission or further testing in the hospital.  Your exam/testing today was overall reassuring.  Bleeding may be due to irritation and due to the tightening of the catheter.  We are also sending the urine for culture to check for a recurrence of infection.  You will be called with any abnormal results.  Please return to the Emergency Department if you experience any worsening of your condition.  Thank you for allowing [place] to be a part of your care.         Maudie Flakes, MD 06/25/21 [phone]

## 2021-06-25 NOTE — ED Triage Notes (Signed)
Wife reports has had catheter for 1 month yet has been changed but had blood in collection bag that was dark.  Now draining dark yellow urine.

## 2021-06-25 NOTE — Discharge Instructions (Addendum)
You were evaluated in the Emergency Department and after careful evaluation, we did not find any emergent condition requiring admission or further testing in the hospital.  Your exam/testing today was overall reassuring.  Bleeding may be due to irritation and due to the tightening of the catheter.  We are also sending the urine for culture to check for a recurrence of infection.  You will be called with any abnormal results.  Please return to the Emergency Department if you experience any worsening of your condition.  Thank you for allowing [place] to be a part of your care.

## 2021-06-26 ENCOUNTER — Encounter: Payer: Self-pay | Admitting: Physician Assistant

## 2021-06-26 LAB — URINE CULTURE: Culture: NO GROWTH

## 2021-06-28 ENCOUNTER — Emergency Department (HOSPITAL_BASED_OUTPATIENT_CLINIC_OR_DEPARTMENT_OTHER)
Admission: EM | Admit: 2021-06-28 | Discharge: 2021-06-28 | Disposition: A | Discharge status: Home / Self Care | Payer: Medicare PPO | Attending: Emergency Medicine | Admitting: Emergency Medicine

## 2021-06-28 ENCOUNTER — Other Ambulatory Visit: Payer: Self-pay

## 2021-06-28 ENCOUNTER — Encounter (HOSPITAL_BASED_OUTPATIENT_CLINIC_OR_DEPARTMENT_OTHER): Payer: Self-pay | Admitting: *Deleted

## 2021-06-28 DIAGNOSIS — F0390 Unspecified dementia without behavioral disturbance: Secondary | ICD-10-CM | POA: Insufficient documentation

## 2021-06-28 DIAGNOSIS — R1030 Lower abdominal pain, unspecified: Secondary | ICD-10-CM | POA: Insufficient documentation

## 2021-06-28 DIAGNOSIS — Z96641 Presence of right artificial hip joint: Secondary | ICD-10-CM | POA: Insufficient documentation

## 2021-06-28 DIAGNOSIS — Z8616 Personal history of COVID-19: Secondary | ICD-10-CM | POA: Diagnosis not present

## 2021-06-28 DIAGNOSIS — T83098A Other mechanical complication of other indwelling urethral catheter, initial encounter: Secondary | ICD-10-CM | POA: Insufficient documentation

## 2021-06-28 DIAGNOSIS — T839XXA Unspecified complication of genitourinary prosthetic device, implant and graft, initial encounter: Secondary | ICD-10-CM

## 2021-06-28 LAB — URINALYSIS, ROUTINE W REFLEX MICROSCOPIC
Bilirubin Urine: NEGATIVE
Glucose, UA: NEGATIVE mg/dL
Ketones, ur: 5 mg/dL — AB
Leukocytes,Ua: NEGATIVE
Nitrite: NEGATIVE
Protein, ur: 100 mg/dL — AB
Specific Gravity, Urine: 1 — ABNORMAL LOW (ref 1.005–1.030)
pH: 6 (ref 5.0–8.0)

## 2021-06-28 LAB — URINALYSIS, MICROSCOPIC (REFLEX): RBC / HPF: >50 RBC/hpf (ref 0–5)

## 2021-06-28 NOTE — ED Provider Notes (Signed)
[facility] EMERGENCY DEPARTMENT Provider Note   CSN: [id number] Arrival date & time: 06/28/21  2104     History Chief Complaint  Patient presents with   Foley Catheter Clogged    Mike Pennington is a 70 y.o. male.  HPI  HPI will be deferred due to level 5 caveat dementia  Patient with significant medical history of dementia, enlarged prostate presents with chief complaint of a blocked Foley catheter.  Wife was at bedside was able to provide HPI.  She states that she noted that her husband was unable to urinate since sometime this afternoon, she noted that there is no urine in his Foley bag.  She then noted that he started to have some suprapubic pain.  She states that this has happened in the past, most recently was yesterday, seen at the urology office where they were able to flush the catheter.  She does not endorse patient complaining of fevers, chills, chest pain, shortness of breath, nausea, vomiting, states he is at his baseline.  Past Medical History:  Diagnosis Date   Dementia [facility])    Enlarged prostate     Patient Active Problem List   Diagnosis Date Noted   COVID-19 virus infection 06/13/2021   Constipation 06/13/2021   BPH (benign prostatic hyperplasia) 06/13/2021   Dementia ([place]) 06/13/2021   UTI (urinary tract infection) 06/12/2021    Past Surgical History:  Procedure Laterality Date   TOTAL HIP ARTHROPLASTY Right        No family history on file.  Social History   Tobacco Use   Smoking status: Never   Smokeless tobacco: Never  Vaping Use   Vaping Use: Never used  Substance Use Topics   Alcohol use: Never   Drug use: Never    Home Medications Prior to Admission medications   Medication Sig Start Date End Date Taking? Authorizing Provider  docusate sodium (COLACE) 100 MG capsule Take 1 capsule (100 mg total) by mouth 2 (two) times daily. 06/14/21 06/14/22  Elgergawy, Silver Huguenin, MD  finasteride (PROSCAR) 5 MG tablet Take 5 mg by mouth  daily.    [provider]  magnesium hydroxide (MILK OF MAGNESIA) 400 MG/5ML suspension Take 30 mLs by mouth daily as needed for moderate constipation. Please take if no  bowel movement by day 3 while using the other regimen 06/14/21 06/14/22  Elgergawy, Silver Huguenin, MD  OVER THE COUNTER MEDICATION Take 1 capsule by mouth daily. Daily bowel capsule    [provider]  polyethylene glycol (MIRALAX / GLYCOLAX) 17 g packet Take 17 g by mouth daily. Please start if patient having constipation, and hold for diarrhea. 06/15/21   Elgergawy, Silver Huguenin, MD  psyllium (METAMUCIL SMOOTH TEXTURE) 58.6 % powder Take 1 packet by mouth daily. 06/14/21   Elgergawy, Silver Huguenin, MD  QUEtiapine (SEROQUEL) 25 MG tablet Please take 25 mg (1 Tablet) oral daily in AM, and 50 mg (2 Tablets) oral at bedtime. 06/14/21   Elgergawy, Silver Huguenin, MD  tamsulosin (FLOMAX) 0.4 MG CAPS capsule Take 0.4 mg by mouth 2 (two) times daily. 05/29/21   [provider]  traMADol (ULTRAM) 50 MG tablet Take 50 mg by mouth 2 (two) times daily as needed for pain. 06/11/21   [provider]    Allergies    Ambien [zolpidem] and Ativan [lorazepam]  Review of Systems   Review of Systems  Unable to perform ROS: Dementia   Physical Exam Updated Vital Signs BP (!) 133/99 (BP Location:  Right Arm)   Pulse 72   Temp 98.6 F (37 C) (Oral)   Resp 20   Ht 6' (1.829 m)   Wt 83.9 kg   SpO2 99%   BMI 25.09 kg/m   Physical Exam Vitals and nursing note reviewed. Exam conducted with a chaperone present.  Constitutional:      General: He is not in acute distress.    Appearance: He is not ill-appearing.  HENT:     Head: Normocephalic and atraumatic.     Nose: No congestion.  Eyes:     Conjunctiva/sclera: Conjunctivae normal.  Cardiovascular:     Rate and Rhythm: Normal rate and regular rhythm.     Pulses: Normal pulses.  Pulmonary:     Effort: Pulmonary effort is normal.  Abdominal:     Palpations: Abdomen is soft.      Tenderness: There is abdominal tenderness. There is no right CVA tenderness or left CVA tenderness.     Comments: Abdomen nondistended, normal bowel sounds, dull to percussion, he had slight tenderness to palpation in suprapubic region, no distention or mass present  No rebound tenderness or peritoneal sign, no guarding.  Genitourinary:    Comments: With chaperone present genital exam was performed no gross deformities of the testicles or penile shaft present, Foley catheter was in place, no discharge or drainage present.  Foley bag had no fluid in it. Skin:    General: Skin is warm and dry.  Neurological:     Mental Status: He is alert.  Psychiatric:        Mood and Affect: Mood normal.    ED Results / Procedures / Treatments   Labs (all labs ordered are listed, but only abnormal results are displayed) Labs Reviewed  URINALYSIS, ROUTINE W REFLEX MICROSCOPIC - Abnormal; Notable for the following components:      Result Value   Color, Urine GREEN (*)    APPearance HAZY (*)    Specific Gravity, Urine 1.000 (*)    Hgb urine dipstick LARGE (*)    Ketones, ur 5 (*)    Protein, ur 100 (*)    All other components within normal limits  URINALYSIS, MICROSCOPIC (REFLEX) - Abnormal; Notable for the following components:   Bacteria, UA FEW (*)    All other components within normal limits    EKG None  Radiology No results found.  Procedures Procedures   Medications Ordered in ED Medications - No data to display  ED Course  I have reviewed the triage vital signs and the nursing notes.  Pertinent labs & imaging results that were available during my care of the patient were reviewed by me and considered in my medical decision making (see chart for details).    MDM Rules/Calculators/A&P                          Initial impression-presents with a clogged Foley catheter, he is alert, does not appear in distress, vital signs reassuring.  Will obtain bladder scan and  flush.  Work-up-bladder scan showed 375, UA negative for nitrates, leukocytes, many red blood cells, few white blood cells, few bacteria.  Reassessment-catheter was flushed, drained without difficulty, drained approximately 500 mL.  Patient is reassessed has no complaints this time, abdomen soft nontender to palpation.  Wife and patient are both agreeable for discharge at this time.  Rule out-I have low suspicion for UTI, [place], kidney stone as UA is negative for signs infection, he has  no CVA tenderness present my exam.  Patient does have many red blood cells but I suspect this is likely due to the catheter which is causing him irritation.  Low suspicion for obstruction as catheter is draining at this time, he has no abdominal tenderness.  Will defer antibiotic treatment at this time and culturing at this time. low suspicion for intra-abdominal abnormality as abdomen soft nontender palpation, vital signs reassuring.  Plan-  Foley catheter issues resolved-unclear etiology for obstructed catheter possible due to enlarged prostate, or due to positioning.  It is currently drained this time, we will have him follow-up with a urologist for further evaluation.  Vital signs have remained stable, no indication for hospital admission.  Patient discussed with attending and they agreed with assessment and plan.  Patient given at home care as well strict return precautions.  Patient verbalized that they understood agreed to said plan.  Final Clinical Impression(s) / ED Diagnoses Final diagnoses:  Foley catheter problem, initial encounter [facility])    Rx / DC Orders ED Discharge Orders     None        Marcello Fennel, PA-C 06/28/21 2340    Lennice Sites, DO 06/29/21 1416

## 2021-06-28 NOTE — Discharge Instructions (Addendum)
Exam was and urinalysis were both reassuring.  Please continue with all home medication as prescribed.  I recommended follow-up with urology tomorrow.   Come back to the emergency department if you develop chest pain, shortness of breath, severe abdominal pain, uncontrolled nausea, vomiting, diarrhea.

## 2021-06-28 NOTE — ED Notes (Signed)
Catheter tubing flushed with approx 30 ml NS, no resistance, urine return as soon as flush administered.

## 2021-06-28 NOTE — ED Triage Notes (Signed)
Here with c.o foley catheter clogged. He had the catheter flushed yesterday at his urologist.

## 2021-07-04 ENCOUNTER — Ambulatory Visit: Payer: Medicare PPO | Admitting: Physician Assistant

## 2021-07-04 ENCOUNTER — Encounter: Payer: Self-pay | Admitting: Physician Assistant

## 2021-07-04 ENCOUNTER — Other Ambulatory Visit: Payer: Self-pay

## 2021-07-04 VITALS — BP 121/78 | HR 74 | Ht 72.0 in | Wt 170.0 lb

## 2021-07-04 DIAGNOSIS — R251 Tremor, unspecified: Secondary | ICD-10-CM | POA: Diagnosis not present

## 2021-07-04 DIAGNOSIS — F02818 Dementia in other diseases classified elsewhere, unspecified severity, with other behavioral disturbance: Secondary | ICD-10-CM

## 2021-07-04 DIAGNOSIS — F0281 Dementia in other diseases classified elsewhere with behavioral disturbance: Secondary | ICD-10-CM

## 2021-07-04 DIAGNOSIS — G300 Alzheimer's disease with early onset: Secondary | ICD-10-CM

## 2021-07-04 MED ORDER — MEMANTINE HCL 10 MG PO TABS: ORAL_TABLET | ORAL | 11 refills | Status: AC

## 2021-07-04 NOTE — Patient Instructions (Signed)
It was a pleasure to see you today at our office.   Recommendations:   Start Memantine '10mg'$  tablets.  Take 1 tablet at bedtime for 2 weeks, then 1 tablet twice daily.   Side effects include dizziness, headache, diarrhea or constipation.  Call with any questions or concerns.  DAT scan  [facility] office to call you Follow up in 2 months  RECOMMENDATIONS FOR ALL PATIENTS WITH MEMORY PROBLEMS: 1. Continue to exercise (Recommend 30 minutes of walking everyday, or 3 hours every week) 2. Increase social interactions - continue going to [place] and enjoy social gatherings with friends and family 3. Eat healthy, avoid fried foods and eat more fruits and vegetables 4. Maintain adequate blood pressure, blood sugar, and blood cholesterol level. Reducing the risk of stroke and cardiovascular disease also helps promoting better memory. 5. Avoid stressful situations. Live a simple life and avoid aggravations. Organize your time and prepare for the next day in anticipation. 6. Sleep well, avoid any interruptions of sleep and avoid any distractions in the bedroom that may interfere with adequate sleep quality 7. Avoid sugar, avoid sweets as there is a strong link between excessive sugar intake, diabetes, and cognitive impairment We discussed the Mediterranean diet, which has been shown to help patients reduce the risk of progressive memory disorders and reduces cardiovascular risk. This includes eating fish, eat fruits and green leafy vegetables, nuts like almonds and hazelnuts, walnuts, and also use olive oil. Avoid fast foods and fried foods as much as possible. Avoid sweets and sugar as sugar use has been linked to worsening of memory function.  There is always a concern of gradual progression of memory problems. If this is the case, then we may need to adjust level of care according to patient needs. Support, both to the patient and caregiver, should then be put into place.    FALL PRECAUTIONS: Be  cautious when walking. Scan the area for obstacles that may increase the risk of trips and falls. When getting up in the mornings, sit up at the edge of the bed for a few minutes before getting out of bed. Consider elevating the bed at the head end to avoid drop of blood pressure when getting up. Walk always in a well-lit room (use night lights in the walls). Avoid area rugs or power cords from appliances in the middle of the walkways. Use a walker or a cane if necessary and consider physical therapy for balance exercise. Get your eyesight checked regularly.  FINANCIAL OVERSIGHT: Supervision, especially oversight when making financial decisions or transactions is also recommended.  HOME SAFETY: Consider the safety of the kitchen when operating appliances like stoves, microwave oven, and blender. Consider having supervision and share cooking responsibilities until no longer able to participate in those. Accidents with firearms and other hazards in the house should be identified and addressed as well.   ABILITY TO BE LEFT ALONE: If patient is unable to contact 911 operator, consider using LifeLine, or when the need is there, arrange for someone to stay with patients. Smoking is a fire hazard, consider supervision or cessation. Risk of wandering should be assessed by caregiver and if detected at any point, supervision and safe proof recommendations should be instituted.  MEDICATION SUPERVISION: Inability to self-administer medication needs to be constantly addressed. Implement a mechanism to ensure safe administration of the medications.       Mediterranean Diet A Mediterranean diet refers to food and lifestyle choices that are based on the traditions of countries  located on the [place]. This way of eating has been shown to help prevent certain conditions and improve outcomes for people who have chronic diseases, like kidney disease and heart disease. What are tips for following this  plan? Lifestyle  Cook and eat meals together with your family, when possible. Drink enough fluid to keep your urine clear or pale yellow. Be physically active every day. This includes: Aerobic exercise like running or swimming. Leisure activities like gardening, walking, or housework. Get 7-8 hours of sleep each night. If recommended by your health care provider, drink red wine in moderation. This means 1 glass a day for nonpregnant women and 2 glasses a day for men. A glass of wine equals 5 oz (150 mL). Reading food labels  Check the serving size of packaged foods. For foods such as rice and pasta, the serving size refers to the amount of cooked product, not dry. Check the total fat in packaged foods. Avoid foods that have saturated fat or trans fats. Check the ingredients list for added sugars, such as corn syrup. Shopping  At the grocery store, buy most of your food from the areas near the walls of the store. This includes: Fresh fruits and vegetables (produce). Grains, beans, nuts, and seeds. Some of these may be available in unpackaged forms or large amounts (in bulk). Fresh seafood. Poultry and eggs. Low-fat dairy products. Buy whole ingredients instead of prepackaged foods. Buy fresh fruits and vegetables in-season from local farmers markets. Buy frozen fruits and vegetables in resealable bags. If you do not have access to quality fresh seafood, buy precooked frozen shrimp or canned fish, such as tuna, salmon, or sardines. Buy small amounts of raw or cooked vegetables, salads, or olives from the deli or salad bar at your store. Stock your pantry so you always have certain foods on hand, such as olive oil, canned tuna, canned tomatoes, rice, pasta, and beans. Cooking  Cook foods with extra-virgin olive oil instead of using butter or other vegetable oils. Have meat as a side dish, and have vegetables or grains as your main dish. This means having meat in small portions or adding  small amounts of meat to foods like pasta or stew. Use beans or vegetables instead of meat in common dishes like chili or lasagna. Experiment with different cooking methods. Try roasting or broiling vegetables instead of steaming or sauteing them. Add frozen vegetables to soups, stews, pasta, or rice. Add nuts or seeds for added healthy fat at each meal. You can add these to yogurt, salads, or vegetable dishes. Marinate fish or vegetables using olive oil, lemon juice, garlic, and fresh herbs. Meal planning  Plan to eat 1 vegetarian meal one day each week. Try to work up to 2 vegetarian meals, if possible. Eat seafood 2 or more times a week. Have healthy snacks readily available, such as: Vegetable sticks with hummus. Greek yogurt. Fruit and nut trail mix. Eat balanced meals throughout the week. This includes: Fruit: 2-3 servings a day Vegetables: 4-5 servings a day Low-fat dairy: 2 servings a day Fish, poultry, or lean meat: 1 serving a day Beans and legumes: 2 or more servings a week Nuts and seeds: 1-2 servings a day Whole grains: 6-8 servings a day Extra-virgin olive oil: 3-4 servings a day Limit red meat and sweets to only a few servings a month What are my food choices? Mediterranean diet Recommended Grains: Whole-grain pasta. Brown rice. Bulgar wheat. Polenta. Couscous. Whole-wheat bread. [place]. Vegetables: Artichokes. Beets.  Broccoli. Cabbage. Carrots. Eggplant. Green beans. Chard. Kale. Spinach. Onions. Leeks. Peas. Squash. Tomatoes. Peppers. Radishes. Fruits: Apples. Apricots. Avocado. Berries. Bananas. Cherries. Dates. Figs. Grapes. Lemons. Melon. Oranges. Peaches. Plums. Pomegranate. Meats and other protein foods: Beans. Almonds. Sunflower seeds. Pine nuts. Peanuts. [place]. Salmon. Scallops. Shrimp. [place]. Tilapia. Clams. Oysters. Eggs. Dairy: Low-fat milk. Cheese. Greek yogurt. Beverages: Water. Red wine. Herbal tea. Fats and oils: Extra virgin olive oil. Avocado  oil. Grape seed oil. Sweets and desserts: [place] yogurt with honey. Baked apples. Poached pears. Trail mix. Seasoning and other foods: Basil. Cilantro. Coriander. Cumin. Mint. Parsley. Sage. Rosemary. Tarragon. Garlic. Oregano. Thyme. Pepper. Balsalmic vinegar. Tahini. Hummus. Tomato sauce. Olives. Mushrooms. Limit these Grains: Prepackaged pasta or rice dishes. Prepackaged cereal with added sugar. Vegetables: Deep fried potatoes (french fries). Fruits: Fruit canned in syrup. Meats and other protein foods: Beef. Pork. Lamb. Poultry with skin. Hot dogs. [place] Salines. Dairy: Ice cream. Sour cream. Whole milk. Beverages: Juice. Sugar-sweetened soft drinks. Beer. Liquor and spirits. Fats and oils: Butter. Canola oil. Vegetable oil. Beef fat (tallow). Lard. Sweets and desserts: Cookies. Cakes. Pies. Candy. Seasoning and other foods: Mayonnaise. Premade sauces and marinades. The items listed may not be a complete list. Talk with your dietitian about what dietary choices are right for you. Summary The Mediterranean diet includes both food and lifestyle choices. Eat a variety of fresh fruits and vegetables, beans, nuts, seeds, and whole grains. Limit the amount of red meat and sweets that you eat. Talk with your health care provider about whether it is safe for you to drink red wine in moderation. This means 1 glass a day for nonpregnant women and 2 glasses a day for men. A glass of wine equals 5 oz (150 mL). This information is not intended to replace advice given to you by your health care provider. Make sure you discuss any questions you have with your health care provider. Document Released: 06/20/2016 Document Revised: 07/23/2016 Document Reviewed: 06/20/2016 Elsevier Interactive Patient Education  2017 [employer].

## 2021-07-04 NOTE — Progress Notes (Addendum)
Assessment/Plan:   Mike Pennington is a 70 y.o. year old male with risk factors including  age, hypertension, hyperlipidemia, CAD, anxiety, Lyme Disease in the past, depression and  a diagnosis of Alzheimer's Dementia , seen today for evaluation of memory loss. MMSE 9/30  Memory Loss   DaTscan will be ordered to evaluate for any indication of a parkinsonian syndrome. Exam today does not have classic findings seen in [facility] Start Memantine '10mg'$  tablets.  Take 1 tablet at bedtime for 2 weeks, then 1 tablet twice daily.   Side effects discussed  Continue to monitor mood with PCP.  Stay active at least 30 minutes at least 3 times a week.  Naps should be scheduled and should be no longer than 60 minutes and should not occur after 2 PM.  Mediterranean diet is recommended  Folllow up once results above are available   Subjective:    The patient is seen in neurologic consultation at the request of Biagio Borg, MD for the evaluation of memory.  The patient is accompanied by wife who supplements the history.  The patient is a 69 year old male who had memory loss for at least 6 or 7 years.  Prior to that, the patient had been diagnosed with Lyme disease, confirmed by LP treated without success about 1 year ago.  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  Slowly, and progressively, he began to have difficulties with direction, and issues with math and calculations, which before they never presented issues.  He began to notice inability to balance the checkbook, remembering appointments, or remembering that he was going to visit his family even though had just had a conversation about it.  He also had moments of disorientation, misplacing objects around the house, such as keys and wallet.  He used to enjoy playing sudoku, and crossword puzzles, and he discontinued his activities as well.  In 2021, he had been seen at [facility] with  same issues, and was had been prescribed Aricept and Memantine, but his wife wife declined to start him on the medications after his integrative physician advised against it.  Lately he has been needing assistance for dressing and bathing because he had issues with a urinary catheter due to retention and with several failed void trials.  This is being followed by urology.  The patient has been noted to have paranoia, afraid that somebody will be following him on trying to harm him or kill him.  Moreover, he states that there are "bad people in the house beside [place] ".  The wife states that there is an element of fear in him.  He does not sleep very well, staying most of the night awake.  During the day, he follows his wife around, concerned where she is, wants to take care of her. Appetite is fairly good, he does not cook.  No apparent dizziness.  The wife noticed that they are more tremors than before in his hands, his steps are more cautious and broader.  No family history of dementia.   MRI brain 2018: No focal, acute or reversible finding. Generalized brain atrophy since 2004. The ventricles, like the sulci, are more prominent. One could not exclude the possibility of normal pressure hydrocephalus, though the changes appear to be in proportion.  MRI brain Aug 2021: Interval increased size of predominantly the lateral ventricles as compared to MRI brain 2018. Although  there is enlargement of the Sylvian fissures and cerebral sulci, progression of ventriculomegaly is somewhat disproportionate. There is also undulating margins of the lateral ventricles, and together these findings raise the possibility of normal pressure hydrocephalus. Consider large volume lumbar puncture for further assessment. No acute findings.  Allergies  Allergen Reactions   Ambien [Zolpidem] Other (See Comments)    Erratic behavior   Ativan [Lorazepam] Other (See Comments)    Per family member, aggression with ativan     Current Outpatient Medications  Medication Instructions   docusate sodium (COLACE) 100 mg, Oral, 2 times daily   finasteride (PROSCAR) 5 mg, Oral, Daily   memantine (NAMENDA) 10 MG tablet Take 1 tablet (10 mg at night) for 2 weeks, then increase to 1 tablet (10 mg) twice a day   OVER THE COUNTER MEDICATION 1 capsule, Oral, Daily, Daily bowel capsule   polyethylene glycol (MIRALAX / GLYCOLAX) 17 g, Oral, Daily, Please start if patient having constipation, and hold for diarrhea.   psyllium (METAMUCIL SMOOTH TEXTURE) 58.6 % powder 1 packet, Oral, Daily   QUEtiapine (SEROQUEL) 25 MG tablet Please take 25 mg (1 Tablet) oral daily in AM, and 50 mg (2 Tablets) oral at bedtime.   tamsulosin (FLOMAX) 0.4 mg, Oral, 2 times daily   traMADol (ULTRAM) 50 mg, Oral, 2 times daily PRN     VITALS:   Vitals:   07/04/21 1350  BP: 121/78  Pulse: 74  SpO2: 97%  Weight: 170 lb (77.1 kg)  Height: 6' (1.829 m)   No flowsheet data found.  PHYSICAL EXAM   HEENT:  Normocephalic, atraumatic. The mucous membranes are moist. The superficial temporal arteries are without ropiness or tenderness. Cardiovascular: Regular rate and rhythm. Lungs: Clear to auscultation bilaterally. Neck: There are no carotid bruits noted bilaterally.  NEUROLOGICAL: No flowsheet data found. MMSE - Mini Mental State Exam 07/04/2021  Orientation to time 0  Orientation to Place 4  Registration 3  Attention/ Calculation 0  Recall 0  Language- name 2 objects 2  Language- repeat 0  Language- follow 3 step command 0  Language- read & follow direction 0  Write a sentence 0  Copy design 0  Total score 9    No flowsheet data found.   Orientation:  Alert not oriented to person, elected to place, not to time.  The patient is very soft-spoken, and some of the words are not coherent.  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  No apparent aphasia or dysarthria.  Fund of knowledge is reduced.  Recent and remote memory Cranial  nerves: There is good facial symmetry. Extraocular muscles are intact and visual fields are full to confrontational testing. Speech is fluent and clear. Soft palate rises symmetrically and there is no tongue deviation. Hearing is intact to conversational tone. Tone: Possible right cogwheeling Sensation: Sensation is intact to light touch and pinprick throughout. Vibration is intact at the bilateral big toe.There is no extinction with double simultaneous stimulation. There is no sensory dermatomal level identified. Coordination: The patient has no difficulty with RAM's or FNF bilaterally. Normal finger to nose  Motor: Strength is 5/5 in the bilateral upper and lower extremities. There is no pronator drift. There are no fasciculations noted. DTR's: Deep tendon reflexes are 2/4 at the bilateral biceps, triceps, brachioradialis, patella and achilles.  Plantar responses are downgoing bilaterally. Gait and Station: The patient is able to ambulate with brother gait, slow steps.  Short stride.  The patient is able to heel toe  walk without any difficulty.The patient is able to ambulate in a tandem fashion. The patient is able to stand in the Romberg position. Tremors: Bilateral hand tremors, more noticeable on the right than to the left.   Thank you for allowing [place] the opportunity to participate in the care of this nice patient. Please do not hesitate to contact us for any questions or concerns.   Total time spent on today's visit was 80 minutes, including both face-to-face time and nonface-to-face time.  Time included that spent on review of records (prior notes available to me/labs/imaging if pertinent), discussing treatment and goals, answering patient's questions and coordinating care.  Cc:  Rudene Anda, MD  Sharene Butters 07/04/2021 8:23 PM

## 2021-07-06 ENCOUNTER — Other Ambulatory Visit: Payer: Self-pay

## 2021-07-06 ENCOUNTER — Encounter (HOSPITAL_BASED_OUTPATIENT_CLINIC_OR_DEPARTMENT_OTHER): Payer: Self-pay | Admitting: Emergency Medicine

## 2021-07-06 ENCOUNTER — Emergency Department (HOSPITAL_BASED_OUTPATIENT_CLINIC_OR_DEPARTMENT_OTHER)
Admission: EM | Admit: 2021-07-06 | Discharge: 2021-07-06 | Disposition: A | Discharge status: Home / Self Care | Payer: Medicare PPO | Attending: Emergency Medicine | Admitting: Emergency Medicine

## 2021-07-06 DIAGNOSIS — T83098A Other mechanical complication of other indwelling urethral catheter, initial encounter: Secondary | ICD-10-CM | POA: Insufficient documentation

## 2021-07-06 DIAGNOSIS — Z96641 Presence of right artificial hip joint: Secondary | ICD-10-CM | POA: Insufficient documentation

## 2021-07-06 DIAGNOSIS — R339 Retention of urine, unspecified: Secondary | ICD-10-CM

## 2021-07-06 DIAGNOSIS — Z8616 Personal history of COVID-19: Secondary | ICD-10-CM | POA: Diagnosis not present

## 2021-07-06 DIAGNOSIS — F0390 Unspecified dementia without behavioral disturbance: Secondary | ICD-10-CM | POA: Diagnosis not present

## 2021-07-06 DIAGNOSIS — T839XXA Unspecified complication of genitourinary prosthetic device, implant and graft, initial encounter: Secondary | ICD-10-CM

## 2021-07-06 NOTE — ED Triage Notes (Signed)
Pt had indwelling foley due to urinary retention. Home health attempted to change foley due to leaking, and was not able to place new foley due to pt being uncooperative due to dementia.

## 2021-07-07 NOTE — ED Provider Notes (Signed)
[facility] EMERGENCY DEPARTMENT Provider Note   CSN: [id number] Arrival date & time: 07/06/21  1905     History Chief Complaint  Patient presents with   Urinary Retention    Mike Pennington is a 70 y.o. male.  HPI    70 year old male with history of dementia, enlarged prostate, indwelling Foley, presents with desire for foley catheter replacement after removal.   Wife reports that she believes she accidentally cut his catheter when she was trying to secure the leg bag, and noted that it began leaking.  Home health nurse deflated the balloon of the catheter, however then he became too agitated, and he pulled out the deflated catheter himself.  After removal, she attempted to replace it with a new catheter, however he was too agitated.  Wife reports this is not abnormal given his history of dementia.  He otherwise has been acting himself, has not had nausea, vomiting, abdominal pain, fevers.  No other concerns.  They are here for a Foley catheter replacement.  Was removed about 4hr ago. Has not seemed to be uncomfortable since then.  Past Medical History:  Diagnosis Date   Dementia [facility])    Enlarged prostate     Patient Active Problem List   Diagnosis Date Noted   COVID-19 virus infection 06/13/2021   Constipation 06/13/2021   BPH (benign prostatic hyperplasia) 06/13/2021   Dementia ([place]) 06/13/2021   UTI (urinary tract infection) 06/12/2021    Past Surgical History:  Procedure Laterality Date   TOTAL HIP ARTHROPLASTY Right        No family history on file.  Social History   Tobacco Use   Smoking status: Never   Smokeless tobacco: Never  Vaping Use   Vaping Use: Never used  Substance Use Topics   Alcohol use: Never   Drug use: Never    Home Medications Prior to Admission medications   Medication Sig Start Date End Date Taking? Authorizing Provider  docusate sodium (COLACE) 100 MG capsule Take 1 capsule (100 mg total) by mouth 2 (two) times daily.  06/14/21 06/14/22  Elgergawy, Silver Huguenin, MD  finasteride (PROSCAR) 5 MG tablet Take 5 mg by mouth daily.    [provider]  memantine (NAMENDA) 10 MG tablet Take 1 tablet (10 mg at night) for 2 weeks, then increase to 1 tablet (10 mg) twice a day 07/04/21   Rondel Jumbo, PA-C  OVER THE COUNTER MEDICATION Take 1 capsule by mouth daily. Daily bowel capsule    [provider]  polyethylene glycol (MIRALAX / GLYCOLAX) 17 g packet Take 17 g by mouth daily. Please start if patient having constipation, and hold for diarrhea. 06/15/21   Elgergawy, Silver Huguenin, MD  psyllium (METAMUCIL SMOOTH TEXTURE) 58.6 % powder Take 1 packet by mouth daily. 06/14/21   Elgergawy, Silver Huguenin, MD  QUEtiapine (SEROQUEL) 25 MG tablet Please take 25 mg (1 Tablet) oral daily in AM, and 50 mg (2 Tablets) oral at bedtime. 06/14/21   Elgergawy, Silver Huguenin, MD  tamsulosin (FLOMAX) 0.4 MG CAPS capsule Take 0.4 mg by mouth 2 (two) times daily. 05/29/21   [provider]  traMADol (ULTRAM) 50 MG tablet Take 50 mg by mouth 2 (two) times daily as needed for pain. 06/11/21   [provider]    Allergies    Ambien [zolpidem] and Ativan [lorazepam]  Review of Systems   Review of Systems  Constitutional:  Negative for fever.  Gastrointestinal:  Negative for abdominal pain, nausea  and vomiting.  Genitourinary:  Positive for difficulty urinating.   Physical Exam Updated Vital Signs BP (!) 142/94 (BP Location: Right Arm)   Pulse 72   Temp 98.2 F (36.8 C) (Oral)   Resp 18   Ht 6' (1.829 m)   Wt 77.1 kg   SpO2 100%   BMI 23.05 kg/m   Physical Exam Vitals and nursing note reviewed.  Constitutional:      General: He is not in acute distress.    Appearance: Normal appearance. He is not ill-appearing, toxic-appearing or diaphoretic.  HENT:     Head: Normocephalic.  Eyes:     Conjunctiva/sclera: Conjunctivae normal.  Cardiovascular:     Rate and Rhythm: Normal rate and regular rhythm.     Pulses: Normal  pulses.  Pulmonary:     Effort: Pulmonary effort is normal. No respiratory distress.  Abdominal:     General: There is no distension.     Tenderness: There is no abdominal tenderness. There is no guarding.  Musculoskeletal:        General: No deformity or signs of injury.     Cervical back: No rigidity.  Skin:    General: Skin is warm and dry.     Coloration: Skin is not jaundiced or pale.  Neurological:     General: No focal deficit present.     Mental Status: He is alert and oriented to person, place, and time.    ED Results / Procedures / Treatments   Labs (all labs ordered are listed, but only abnormal results are displayed) Labs Reviewed - No data to display  EKG None  Radiology No results found.  Procedures Procedures   Medications Ordered in ED Medications - No data to display  ED Course  I have reviewed the triage vital signs and the nursing notes.  Pertinent labs & imaging results that were available during my care of the patient were reviewed by me and considered in my medical decision making (see chart for details).    MDM Rules/Calculators/A&P                            70 year old male with history of dementia, enlarged prostate, indwelling Foley, presents with desire for foley catheter replacement after removal.   Catheter replaced by nursing without other concerns.  History and exam do not suggest other complications or infection  Patient discharged in stable condition with understanding of reasons to return.   Final Clinical Impression(s) / ED Diagnoses Final diagnoses:  Urinary retention  Difficult Foley catheter placement [facility])    Rx / DC Orders ED Discharge Orders     None        Gareth Morgan, MD 07/09/21 [phone]

## 2021-07-11 ENCOUNTER — Other Ambulatory Visit: Payer: Self-pay

## 2021-07-11 DIAGNOSIS — R251 Tremor, unspecified: Secondary | ICD-10-CM

## 2021-07-12 ENCOUNTER — Telehealth: Payer: Self-pay | Admitting: Physician Assistant

## 2021-07-12 NOTE — Telephone Encounter (Signed)
Pt wife called in, she would like to talk to [facility] about her husbands scan that he has coming up. She doesn't think he will go through with it and she has some concerns about it. She wants to know what her thoughts are about it.

## 2021-07-13 NOTE — Telephone Encounter (Signed)
No answer at 342

## 2021-07-18 NOTE — Telephone Encounter (Signed)
Sent message to sara already, awaiting response.

## 2021-07-18 NOTE — Telephone Encounter (Signed)
Pt's wife called in wanting to talk to Clarise Cruz about her husbands Dat scan that he has coming up. She is concerned about it taking an hour and then coming back later in the day. She is concerned he will not be able to handle that time frame.

## 2021-07-18 NOTE — Telephone Encounter (Signed)
I left message to contact scheduling if more questions, needed to call office back, central scheduling number given. [phone].

## 2021-07-19 NOTE — Telephone Encounter (Signed)
Wife called back, she said she thinks she is being misunderstood. She doesn't think her husband will be able to do the scan for an hr. She said there has to be another way to help him have the scan. She doesn't want to talk to anyone at radiology, she wants to speak with [facility]man or nurse about this.

## 2021-07-20 ENCOUNTER — Telehealth: Payer: Self-pay | Admitting: Physician Assistant

## 2021-07-20 NOTE — Telephone Encounter (Signed)
Patient is going to contact PCP for recommendations.

## 2021-07-20 NOTE — Telephone Encounter (Signed)
Wife advised of time of appt.

## 2021-07-20 NOTE — Telephone Encounter (Signed)
Patient notified, will cancel appt at [facility].Follow up appt.

## 2021-07-20 NOTE — Telephone Encounter (Signed)
Pt's wife called in stating the patient's memantine seems to be causing constipation. She has him on a stool softener, probiotic with fiber, and Miralax. It doesn't seem to be completely working. It is somewhat normal for him to get constipated, but the medication seem's to be making it worse. She would like to find out what Mike Pennington recommends?

## 2021-07-26 ENCOUNTER — Other Ambulatory Visit (HOSPITAL_COMMUNITY): Payer: Medicare PPO

## 2021-07-26 ENCOUNTER — Other Ambulatory Visit: Payer: Self-pay | Admitting: Urology

## 2021-08-02 NOTE — Progress Notes (Signed)
Assessment/Plan:    Early Onset Alzheimer's Dementia   70 y.o. year old male with a diagnosis of Alzheimer's Dementia , seen today for evaluation of memory loss. Last  MMSE 9/30. Currently on Memantine 10 mg bid, tolerating well.    Recommendations:  Discussed safety both in and out of the home.  Discussed the importance of regular daily schedule maintain brain function.  Continue to monitor mood by PCP Naps should be scheduled and should be no longer than 60 minutes and should not occur after 2 PM.  MRI brain to evaluate structural changes and vascular load.  Continue Memantine 10 mg twice daily.Side effects were discussed  Follow up in 6 months. A copy of MMSE provided to wife per her request  Referral to Psychiatry for medication management for agitation    Case discussed with Dr. Delice Lesch who agrees with the plan   Subjective:   ED visits since last seen:07/06/21 for urinary retention after catheter leakage.  Hospital admissions: none   Mike Pennington is a very pleasant  70 y.o. year old male with risk factors including hypertension, hyperlipidemia, CAD, anxiety, Lyme Disease in the past,  Covid 06/2021, depression and a diagnosis of Alzheimer's Dementia seen today for evaluation of memory loss.  He was initially seen on 07/04/21 at which time his MMSE was 9/30. This patient is accompanied in the office by his wife who supplements the history. Previous records as well as any outside records available were reviewed prior to todays visit. Of note, wife states that there are some notes from PCP that were faxed to [place] for review, although never received, unavailable at this time. Memantine 10 mg daily was initiated according to wife, not noticing any changes "do not notice any difference". Of note, his wife reports that has moments of agitation. She said that having to go into the elevator to come to this appointment was difficult.  Sleeps better since memantine although hallucinations  and paranoia are present. He takes Seroquel by PCP. His wife assists him to shower, bathes every other day. He also does not want to take his clothes off lately. Wife administers the meds and controls the finances. Appetite is decreased but he craves more sweets No trouble swallowing or increased salivation is reported. He ambulates without difficulty without the use of a walk or cane. Denies falls or head injuries. His steps are more cautious and broader, but unchanged from prior visit.  He no longer drives,  Denies headaches, anosmia, double vision, dizziness, focal numbness or tingling, unilateral weakness . Tremors in his hands are unchanged from prior. DaT was planned but it was cancelled as his wife felt that he would not be able to tolerate, no plans for performing the test at this time.  He had some complication with the urinary catheter, with some malfunction, and on 9/9 /22 with E cloacae infection requiring antibiotics. He is still on catheter due to retention, he also had acute cystitis recently. He is followed by Urology and he I scheduled for TURP 08/13/21.  He had some constipation, unclear if related to memantine as this was also noted on ED note in 06/13/21 treated with enema. He takes probiotics and Senokot with good results.      MRI brain 2018: No focal, acute or reversible finding. Generalized brain atrophy since 2004. The ventricles, like the sulci, are more prominent. One could not exclude the possibility of normal pressure hydrocephalus, though the changes appear to be in proportion.  MRI brain Aug 2021: Interval increased size of predominantly the lateral ventricles as compared to MRI brain 2018. Although there is enlargement of the Sylvian fissures and cerebral sulci, progression of ventriculomegaly is somewhat disproportionate. There is also undulating margins of the lateral ventricles, and together these findings raise the possibility of normal pressure hydrocephalus. Consider  large volume lumbar puncture for further assessment. No acute findings.  PREVIOUS MEDICATIONS:   CURRENT MEDICATIONS:  Outpatient Encounter Medications as of 08/03/2021  Medication Sig   docusate sodium (COLACE) 100 MG capsule Take 1 capsule (100 mg total) by mouth 2 (two) times daily. (Patient taking differently: Take 100 mg by mouth daily.)   ferrous sulfate 325 (65 FE) MG tablet Take 325 mg by mouth daily with breakfast.   finasteride (PROSCAR) 5 MG tablet Take 5 mg by mouth daily.   memantine (NAMENDA) 10 MG tablet Take 1 tablet (10 mg at night) for 2 weeks, then increase to 1 tablet (10 mg) twice a day (Patient taking differently: Take 10 mg by mouth 2 (two) times daily.)   OVER THE COUNTER MEDICATION Take 1 capsule by mouth daily. Daily bowel capsule   polyethylene glycol (MIRALAX / GLYCOLAX) 17 g packet Take 17 g by mouth daily. Please start if patient having constipation, and hold for diarrhea.   QUEtiapine (SEROQUEL) 25 MG tablet Please take 25 mg (1 Tablet) oral daily in AM, and 50 mg (2 Tablets) oral at bedtime. (Patient taking differently: Take 75 mg by mouth at bedtime.)   tamsulosin (FLOMAX) 0.4 MG CAPS capsule Take 0.4 mg by mouth 2 (two) times daily.   traMADol (ULTRAM) 50 MG tablet Take 50 mg by mouth 2 (two) times daily as needed for pain.   No facility-administered encounter medications on file as of 08/03/2021.     Objective:     PHYSICAL EXAMINATION:    VITALS:   Vitals:   08/03/21 1108  BP: (!) 150/88  Pulse: 87  Resp: 20  SpO2: 100%  Weight: 172 lb (78 kg)  Height: 6' (1.829 m)    GEN:  The patient appears stated age and is in NAD. HEENT:  Normocephalic, atraumatic.   Neurological examination:  General: NAD, well-groomed, appears stated age. Orientation: The patient is alert. Oriented to person, place not to time. Soft spoken, at times his words may sound not coherent. Continues to show some TD on his lips.  Cranial nerves:  There is good facial  symmetry.The speech is  not fluent and or clear. No aphasia or dysarthria. Fund of knowledge is reduced. Recent and remote memory are impaired. Attention and concentration are reduced.  Able to name objects and repeat phrases.  Hearing is intact to conversational tone.    Sensation: Sensation is intact to light touch throughout Motor: Strength is at least antigravity x4. Mild cogwheeling R>L   Tremors: Bilateral hand tremors, more noticeable on the R>L  DTR's 2/4 in UE/LE    No flowsheet data found. MMSE - Mini Mental State Exam 07/04/2021  Orientation to time 0  Orientation to Place 4  Registration 3  Attention/ Calculation 0  Recall 0  Language- name 2 objects 2  Language- repeat 0  Language- follow 3 step command 0  Language- read & follow direction 0  Write a sentence 0  Copy design 0  Total score 9    No flowsheet data found.     Movement examination: Tone: There is normal tone in the UE/LE Abnormal movements:  no tremor.  No  myoclonus.  No asterixis.   Coordination:  There is no decremation with RAM's. Normal finger to nose  Gait and Station: The patient has no difficulty arising out of a deep-seated chair without the use of the hands. The patient's stride length is good.  Gait is cautious and narrow.        Total time spent on today's visit was 90 minutes, including both face-to-face time and nonface-to-face time. Time included that spent on review of records (prior notes available to me/labs/imaging if pertinent), discussing treatment and goals, answering patient's questions and coordinating care.  Cc:  Rudene Anda, MD Sharene Butters, PA-C

## 2021-08-03 ENCOUNTER — Encounter: Payer: Self-pay | Admitting: Physician Assistant

## 2021-08-03 ENCOUNTER — Ambulatory Visit: Payer: Medicare PPO | Admitting: Physician Assistant

## 2021-08-03 ENCOUNTER — Telehealth: Payer: Self-pay

## 2021-08-03 ENCOUNTER — Other Ambulatory Visit: Payer: Self-pay

## 2021-08-03 VITALS — BP 150/88 | HR 87 | Resp 20 | Ht 72.0 in | Wt 172.0 lb

## 2021-08-03 DIAGNOSIS — G300 Alzheimer's disease with early onset: Secondary | ICD-10-CM | POA: Diagnosis not present

## 2021-08-03 DIAGNOSIS — R251 Tremor, unspecified: Secondary | ICD-10-CM

## 2021-08-03 DIAGNOSIS — F0281 Dementia in other diseases classified elsewhere with behavioral disturbance: Secondary | ICD-10-CM

## 2021-08-03 NOTE — Progress Notes (Signed)
DUE TO COVID-19 ONLY ONE VISITOR IS ALLOWED TO COME WITH YOU AND STAY IN THE WAITING ROOM ONLY DURING PRE OP AND PROCEDURE DAY OF SURGERY. THE 1 VISITOR  MAY VISIT WITH YOU AFTER SURGERY IN YOUR PRIVATE ROOM DURING VISITING HOURS ONLY!  YOU NEED TO HAVE A COVID 19 TEST ON__  08/09/2021 _____ @_______ , THIS TEST MUST BE DONE BEFORE SURGERY,  COVID TESTING SITE IS AT [place]. PLEASE REMAIN IN YOUR CAR THIS IS A DRIVER UP TEST. AFTER YOUR COVID TEST PLEASE WEAR A MASK OUT IN PUBLIC AND SOCIAL DISTANCE AND [place] YOUR HANDS FREQUENTLY. PLEASE ASK ALL YOUR CLOSE CONTACTS TO WEAR A MASK OUT IN PUBLIC AND SOCIAL DISTANCE AND [place] HANDS FREQUENTLY ALSO.               Mike Pennington  08/03/2021   Your procedure is scheduled on:           08/13/2021   Report to [facility] Main  Entrance   Report to admitting at    [place] [facility]     Call this number if you have problems the morning of surgery [phone]    Remember: Do not eat food , candy gum or mints :After Midnight. You may have clear liquids from midnight until __ 0600am    CLEAR LIQUID DIET   Foods Allowed                                                                       Coffee and tea, regular and decaf                              Plain Jell-O any favor except red or purple                                            Fruit ices (not with fruit pulp)                                      Iced Popsicles                                     Carbonated beverages, regular and diet                                    Cranberry, grape and apple juices Sports drinks like Gatorade Lightly seasoned clear broth or consume(fat free) Sugar   _____________________________________________________________________    BRUSH YOUR TEETH MORNING OF SURGERY AND RINSE YOUR MOUTH OUT, NO CHEWING GUM CANDY OR MINTS.     Take these medicines the morning of surgery with A SIP OF WATER:  namenda, flomax   DO NOT TAKE ANY  DIABETIC MEDICATIONS DAY OF YOUR SURGERY  You may not have any metal on your body including hair pins and              piercings  Do not wear jewelry, make-up, lotions, powders or perfumes, deodorant             Do not wear nail polish on your fingernails.  Do not shave  48 hours prior to surgery.              Men may shave face and neck.   Do not bring valuables to the hospital. [place].  Contacts, dentures or bridgework may not be worn into surgery.  Leave suitcase in the car. After surgery it may be brought to your room.     Patients discharged the day of surgery will not be allowed to drive home. IF YOU ARE HAVING SURGERY AND GOING HOME THE SAME DAY, YOU MUST HAVE AN ADULT TO DRIVE YOU HOME AND BE WITH YOU FOR 24 HOURS. YOU MAY GO HOME BY TAXI OR UBER OR ORTHERWISE, BUT AN ADULT MUST ACCOMPANY YOU HOME AND STAY WITH YOU FOR 24 HOURS.  Name and phone number of your driver:  Special Instructions: N/A              Please read over the following fact sheets you were given: _____________________________________________________________________  [facility] - Preparing for Surgery Before surgery, you can play an important role.  Because skin is not sterile, your skin needs to be as free of germs as possible.  You can reduce the number of germs on your skin by washing with CHG (chlorahexidine gluconate) soap before surgery.  CHG is an antiseptic cleaner which kills germs and bonds with the skin to continue killing germs even after washing. Please DO NOT use if you have an allergy to CHG or antibacterial soaps.  If your skin becomes reddened/irritated stop using the CHG and inform your nurse when you arrive at Short Stay. Do not shave (including legs and underarms) for at least 48 hours prior to the first CHG shower.  You may shave your face/neck. Please follow these instructions carefully:  1.  Shower with CHG Soap  the night before surgery and the  morning of Surgery.  2.  If you choose to wash your hair, wash your hair first as usual with your  normal  shampoo.  3.  After you shampoo, rinse your hair and body thoroughly to remove the  shampoo.                           4.  Use CHG as you would any other liquid soap.  You can apply chg directly  to the skin and wash                       Gently with a scrungie or clean washcloth.  5.  Apply the CHG Soap to your body ONLY FROM THE NECK DOWN.   Do not use on face/ open                           Wound or open sores. Avoid contact with eyes, ears mouth and genitals (private parts).  Wash face,  Genitals (private parts) with your normal soap.             6.  Wash thoroughly, paying special attention to the area where your surgery  will be performed.  7.  Thoroughly rinse your body with warm water from the neck down.  8.  DO NOT shower/wash with your normal soap after using and rinsing off  the CHG Soap.                9.  Pat yourself dry with a clean towel.            10.  Wear clean pajamas.            11.  Place clean sheets on your bed the night of your first shower and do not  sleep with pets. Day of Surgery : Do not apply any lotions/deodorants the morning of surgery.  Please wear clean clothes to the hospital/surgery center.  FAILURE TO FOLLOW THESE INSTRUCTIONS MAY RESULT IN THE CANCELLATION OF YOUR SURGERY PATIENT SIGNATURE_________________________________  NURSE SIGNATURE__________________________________  ________________________________________________________________________

## 2021-08-03 NOTE — Patient Instructions (Addendum)
We have sent a referral to [facility] for your MRI and they will call you directly to schedule your appointment. They are located at [place]. If you need to contact them directly please call [phone].  It was a pleasure to see you today at our office.   Recommendations:  Meds: Follow up in 6  months Continue Memantine 10 mg twice daily. MRI of the brain  Refferal to Psychiatry     RECOMMENDATIONS FOR ALL PATIENTS WITH MEMORY PROBLEMS: 1. Continue to exercise (Recommend 30 minutes of walking everyday, or 3 hours every week) 2. Increase social interactions - continue going to [place] and enjoy social gatherings with friends and family 3. Eat healthy, avoid fried foods and eat more fruits and vegetables 4. Maintain adequate blood pressure, blood sugar, and blood cholesterol level. Reducing the risk of stroke and cardiovascular disease also helps promoting better memory. 5. Avoid stressful situations. Live a simple life and avoid aggravations. Organize your time and prepare for the next day in anticipation. 6. Sleep well, avoid any interruptions of sleep and avoid any distractions in the bedroom that may interfere with adequate sleep quality 7. Avoid sugar, avoid sweets as there is a strong link between excessive sugar intake, diabetes, and cognitive impairment We discussed the Mediterranean diet, which has been shown to help patients reduce the risk of progressive memory disorders and reduces cardiovascular risk. This includes eating fish, eat fruits and green leafy vegetables, nuts like almonds and hazelnuts, walnuts, and also use olive oil. Avoid fast foods and fried foods as much as possible. Avoid sweets and sugar as sugar use has been linked to worsening of memory function.  There is always a concern of gradual progression of memory problems. If this is the case, then we may need to adjust level of care according to patient needs. Support, both to the patient and  caregiver, should then be put into place.      FALL PRECAUTIONS: Be cautious when walking. Scan the area for obstacles that may increase the risk of trips and falls. When getting up in the mornings, sit up at the edge of the bed for a few minutes before getting out of bed. Consider elevating the bed at the head end to avoid drop of blood pressure when getting up. Walk always in a well-lit room (use night lights in the walls). Avoid area rugs or power cords from appliances in the middle of the walkways. Use a walker or a cane if necessary and consider physical therapy for balance exercise. Get your eyesight checked regularly.  FINANCIAL OVERSIGHT: Supervision, especially oversight when making financial decisions or transactions is also recommended.  HOME SAFETY: Consider the safety of the kitchen when operating appliances like stoves, microwave oven, and blender. Consider having supervision and share cooking responsibilities until no longer able to participate in those. Accidents with firearms and other hazards in the house should be identified and addressed as well.   ABILITY TO BE LEFT ALONE: If patient is unable to contact 911 operator, consider using LifeLine, or when the need is there, arrange for someone to stay with patients. Smoking is a fire hazard, consider supervision or cessation. Risk of wandering should be assessed by caregiver and if detected at any point, supervision and safe proof recommendations should be instituted.  MEDICATION SUPERVISION: Inability to self-administer medication needs to be constantly addressed. Implement a mechanism to ensure safe administration of the medications.    Mediterranean Diet A Mediterranean diet refers to  food and lifestyle choices that are based on the traditions of countries located on the [employer]. This way of eating has been shown to help prevent certain conditions and improve outcomes for people who have chronic diseases, like kidney  disease and heart disease. What are tips for following this plan? Lifestyle  Cook and eat meals together with your family, when possible. Drink enough fluid to keep your urine clear or pale yellow. Be physically active every day. This includes: Aerobic exercise like running or swimming. Leisure activities like gardening, walking, or housework. Get 7-8 hours of sleep each night. If recommended by your health care provider, drink red wine in moderation. This means 1 glass a day for nonpregnant women and 2 glasses a day for men. A glass of wine equals 5 oz (150 mL). Reading food labels  Check the serving size of packaged foods. For foods such as rice and pasta, the serving size refers to the amount of cooked product, not dry. Check the total fat in packaged foods. Avoid foods that have saturated fat or trans fats. Check the ingredients list for added sugars, such as corn syrup. Shopping  At the grocery store, buy most of your food from the areas near the walls of the store. This includes: Fresh fruits and vegetables (produce). Grains, beans, nuts, and seeds. Some of these may be available in unpackaged forms or large amounts (in bulk). Fresh seafood. Poultry and eggs. Low-fat dairy products. Buy whole ingredients instead of prepackaged foods. Buy fresh fruits and vegetables in-season from local farmers markets. Buy frozen fruits and vegetables in resealable bags. If you do not have access to quality fresh seafood, buy precooked frozen shrimp or canned fish, such as tuna, salmon, or sardines. Buy small amounts of raw or cooked vegetables, salads, or olives from the deli or salad bar at your store. Stock your pantry so you always have certain foods on hand, such as olive oil, canned tuna, canned tomatoes, rice, pasta, and beans. Cooking  Cook foods with extra-virgin olive oil instead of using butter or other vegetable oils. Have meat as a side dish, and have vegetables or grains as your main  dish. This means having meat in small portions or adding small amounts of meat to foods like pasta or stew. Use beans or vegetables instead of meat in common dishes like chili or lasagna. Experiment with different cooking methods. Try roasting or broiling vegetables instead of steaming or sauteing them. Add frozen vegetables to soups, stews, pasta, or rice. Add nuts or seeds for added healthy fat at each meal. You can add these to yogurt, salads, or vegetable dishes. Marinate fish or vegetables using olive oil, lemon juice, garlic, and fresh herbs. Meal planning  Plan to eat 1 vegetarian meal one day each week. Try to work up to 2 vegetarian meals, if possible. Eat seafood 2 or more times a week. Have healthy snacks readily available, such as: Vegetable sticks with hummus. Greek yogurt. Fruit and nut trail mix. Eat balanced meals throughout the week. This includes: Fruit: 2-3 servings a day Vegetables: 4-5 servings a day Low-fat dairy: 2 servings a day Fish, poultry, or lean meat: 1 serving a day Beans and legumes: 2 or more servings a week Nuts and seeds: 1-2 servings a day Whole grains: 6-8 servings a day Extra-virgin olive oil: 3-4 servings a day Limit red meat and sweets to only a few servings a month What are my food choices? Mediterranean diet Recommended Grains: Whole-grain pasta. [place]  rice. Bulgar wheat. Polenta. Couscous. Whole-wheat bread. [place]. Vegetables: Artichokes. Beets. Broccoli. Cabbage. Carrots. Eggplant. Green beans. Chard. Kale. Spinach. Onions. Leeks. Peas. Squash. Tomatoes. Peppers. Radishes. Fruits: Apples. Apricots. Avocado. Berries. Bananas. Cherries. Dates. Figs. Grapes. Lemons. Melon. Oranges. Peaches. Plums. Pomegranate. Meats and other protein foods: Beans. Almonds. Sunflower seeds. Pine nuts. Peanuts. [place]. Salmon. Scall[place]. Shrimp. [place]. Tilapia. Clams. Oysters. Eggs. Dairy: Low-fat milk. Cheese. Greek yogurt. Beverages: Water. Red wine.  Herbal tea. Fats and oils: Extra virgin olive oil. Avocado oil. Grape seed oil. Sweets and desserts: [place] yogurt with honey. Baked apples. Poached pears. Trail mix. Seasoning and other foods: Basil. Cilantro. Coriander. Cumin. Mint. Parsley. Sage. Rosemary. Tarragon. Garlic. Oregano. Thyme. Pepper. Balsalmic vinegar. Tahini. Hummus. Tomato sauce. Olives. Mushrooms. Limit these Grains: Prepackaged pasta or rice dishes. Prepackaged cereal with added sugar. Vegetables: Deep fried potatoes (french fries). Fruits: Fruit canned in syrup. Meats and other protein foods: Beef. Pork. Lamb. Poultry with skin. Hot dogs. Berniece Salines. Dairy: Ice cream. Sour cream. Whole milk. Beverages: Juice. Sugar-sweetened soft drinks. Beer. Liquor and spirits. Fats and oils: Butter. Canola oil. Vegetable oil. Beef fat (tallow). Lard. Sweets and desserts: Cookies. Cakes. Pies. Candy. Seasoning and other foods: Mayonnaise. Premade sauces and marinades. The items listed may not be a complete list. Talk with your dietitian about what dietary choices are right for you. Summary The Mediterranean diet includes both food and lifestyle choices. Eat a variety of fresh fruits and vegetables, beans, nuts, seeds, and whole grains. Limit the amount of red meat and sweets that you eat. Talk with your health care provider about whether it is safe for you to drink red wine in moderation. This means 1 glass a day for nonpregnant women and 2 glasses a day for men. A glass of wine equals 5 oz (150 mL). This information is not intended to replace advice given to you by your health care provider. Make sure you discuss any questions you have with your health care provider. Document Released: 06/20/2016 Document Revised: 07/23/2016 Document Reviewed: 06/20/2016 Elsevier Interactive Patient Education  2017 [employer].

## 2021-08-03 NOTE — Progress Notes (Addendum)
Anesthesia Review:  PCP: DR Rudene Anda- Lov- 06/28/21  Cardiologist : Chest x-ray : EKG :06/12/21  Echo : Stress test: Cardiac Cath :  Activity level: can do a flight of stairs without difficulty  Sleep Study/ CPAP : none  Fasting Blood Sugar :      / Checks Blood Sugar -- times a day:   Blood Thinner/ Instructions /Last Dose: ASA / Instructions/ Last Dose :   Neuro: Sharene Butters- LOV 07/04/21  Pt has Dementia Sometimes pt answers questions and sometimes he does not.  Wife will need to be with pt entire time.  Wife has POA.  Will bring day of surgery.  Wife speaks quietly around pt and softly.  Wife states to say procedure not surgery.  Wife reports that she has script for Valium day of surgery given by MD.  Christen Butter aware of above.  Okay for Valium preprocedure per [facility].  Wife aware that Valium is okay prior to procedure.  Wife to call nurse with dosage of Valium.   Per note of Neurology - pt does have agitation, hallucinations and paranoia.   Pos for covid on 06/12/21.  No covid test.  Wife aware.   CBC done 08/07/21 routed to DR Bell.   Called wife on 08/08/21- wife reports dose of Valium is 10 mg that MD has ordered preop.   Asked wife in regards to pt being combative- wife reports only slight combativeness nothing major.

## 2021-08-03 NOTE — Telephone Encounter (Signed)
On discharging patient, wife requested MRI to be held till a later date. I notified this to Dr.Aquino and Sharene Butters, d'ced order.

## 2021-08-05 DIAGNOSIS — G300 Alzheimer's disease with early onset: Secondary | ICD-10-CM | POA: Insufficient documentation

## 2021-08-05 DIAGNOSIS — F02818 Dementia in other diseases classified elsewhere, unspecified severity, with other behavioral disturbance: Secondary | ICD-10-CM | POA: Insufficient documentation

## 2021-08-07 ENCOUNTER — Other Ambulatory Visit: Payer: Self-pay

## 2021-08-07 ENCOUNTER — Encounter (HOSPITAL_COMMUNITY)
Admission: RE | Admit: 2021-08-07 | Discharge: 2021-08-07 | Disposition: A | Discharge status: Home / Self Care | Payer: Medicare PPO | Source: Ambulatory Visit | Attending: Urology | Admitting: Urology

## 2021-08-07 ENCOUNTER — Encounter (HOSPITAL_COMMUNITY): Payer: Self-pay

## 2021-08-07 DIAGNOSIS — Z79899 Other long term (current) drug therapy: Secondary | ICD-10-CM | POA: Insufficient documentation

## 2021-08-07 DIAGNOSIS — D291 Benign neoplasm of prostate: Secondary | ICD-10-CM | POA: Diagnosis not present

## 2021-08-07 DIAGNOSIS — F0280 Dementia in other diseases classified elsewhere without behavioral disturbance: Secondary | ICD-10-CM | POA: Diagnosis not present

## 2021-08-07 DIAGNOSIS — Z01812 Encounter for preprocedural laboratory examination: Secondary | ICD-10-CM | POA: Insufficient documentation

## 2021-08-07 DIAGNOSIS — G309 Alzheimer's disease, unspecified: Secondary | ICD-10-CM | POA: Diagnosis not present

## 2021-08-07 HISTORY — DX: Anxiety disorder, unspecified: F41.9

## 2021-08-07 HISTORY — DX: Personal history of urinary calculi: Z87.442

## 2021-08-07 HISTORY — DX: Depression, unspecified: F32.A

## 2021-08-07 LAB — BASIC METABOLIC PANEL
Anion gap: 8 (ref 5–15)
BUN: 28 mg/dL — ABNORMAL HIGH (ref 8–23)
CO2: 27 mmol/L (ref 22–32)
Calcium: 9.6 mg/dL (ref 8.9–10.3)
Chloride: 106 mmol/L (ref 98–111)
Creatinine, Ser: 0.95 mg/dL (ref 0.61–1.24)
GFR, Estimated: >60 mL/min (ref >60)
Glucose, Bld: 79 mg/dL (ref 70–99)
Potassium: 4.7 mmol/L (ref 3.5–5.1)
Sodium: 141 mmol/L (ref 135–145)

## 2021-08-07 LAB — CBC
HCT: 54.2 % — ABNORMAL HIGH (ref 39.0–52.0)
Hemoglobin: 17.5 g/dL — ABNORMAL HIGH (ref 13.0–17.0)
MCH: 29.7 pg (ref 26.0–34.0)
MCHC: 32.3 g/dL (ref 30.0–36.0)
MCV: 92 fL (ref 80.0–100.0)
Platelets: 268 10*3/uL (ref 150–400)
RBC: 5.89 MIL/uL — ABNORMAL HIGH (ref 4.22–5.81)
RDW: 19.4 % — ABNORMAL HIGH (ref 11.5–15.5)
WBC: 5.6 10*3/uL (ref 4.0–10.5)
nRBC: 0 % (ref 0.0–0.2)

## 2021-08-07 NOTE — Telephone Encounter (Signed)
Close encounter 

## 2021-08-08 NOTE — Progress Notes (Addendum)
Anesthesia Chart Review   Case: [id number] Date/Time: 08/13/21 0845   Procedure: TRANSURETHRAL RESECTION OF THE PROSTATE (TURP)   Anesthesia type: General   Pre-op diagnosis: BENIGN PROSTATE HYPERPLASIA   Location: [place] / [facility] OR[facility]   Surgeons: [name] Mallow, MD       DISCUSSION:70 y.o. never smoker with h/o HTN, Alzheimer's dementia, BPH scheduled for above procedure 08/13/2021 with Dr. Link Snuffer.   Pt with agitation, wife reports some combativeness.  Dr. Gloriann Loan has ordered Valium for DOS.   VS: BP 137/90   Pulse 80   Temp 36.9 C (Oral)   Resp 16   Ht 6' (1.829 m)   Wt 78 kg   SpO2 100%   BMI 23.33 kg/m   PROVIDERS: Rudene Anda, MD is PCP   LABS: Labs reviewed: Acceptable for surgery. (all labs ordered are listed, but only abnormal results are displayed)  Labs Reviewed  BASIC METABOLIC PANEL - Abnormal; Notable for the following components:      Result Value   BUN 28 (*)    All other components within normal limits  CBC - Abnormal; Notable for the following components:   RBC 5.89 (*)    Hemoglobin 17.5 (*)    HCT 54.2 (*)    RDW 19.4 (*)    All other components within normal limits  TYPE AND SCREEN     IMAGES:   EKG: 07/02/21 Rate 79 bpm  Sinus rhythm RBBB and LAFB ST elevation present inferior leads When compared with ECG of EARLIER SAME DATE No significant change was found  CV:  Past Medical History:  Diagnosis Date   Anxiety    Dementia ([place])    Depression    Enlarged prostate    History of kidney stones     Past Surgical History:  Procedure Laterality Date   TOTAL HIP ARTHROPLASTY Right     MEDICATIONS:  docusate sodium (COLACE) 100 MG capsule   ferrous sulfate 325 (65 FE) MG tablet   finasteride (PROSCAR) 5 MG tablet   memantine (NAMENDA) 10 MG tablet   OVER THE COUNTER MEDICATION   polyethylene glycol (MIRALAX / GLYCOLAX) 17 g packet   QUEtiapine (SEROQUEL) 25 MG tablet   tamsulosin (FLOMAX) 0.4 MG CAPS capsule    traMADol (ULTRAM) 50 MG tablet   No current facility-administered medications for this encounter.     Konrad Felix Ward, PA-C [facility] Pre-Surgical Testing [phone]

## 2021-08-10 NOTE — Progress Notes (Signed)
Wife called to inform nurse that there may be difficulty in getting husband to take shower nite before and am of surgery.  Instructed wife to concentrate on am shower.  Informed wife that I would let   Short Stay be aware of this.  Short Stay made aware.  Spoke with Bethena Roys , RN in short stay.

## 2021-08-11 NOTE — Anesthesia Preprocedure Evaluation (Addendum)
Anesthesia Evaluation  Patient identified by MRN, date of birth, ID band Patient awake    Reviewed: Allergy & Precautions, NPO status , Patient's Chart, lab work & pertinent test results  Airway Mallampati: II  TM Distance: >3 FB Neck ROM: Full    Dental no notable dental hx.    Pulmonary neg pulmonary ROS,    Pulmonary exam normal breath sounds clear to auscultation       Cardiovascular Exercise Tolerance: Good Normal cardiovascular exam Rhythm:Regular Rate:Normal     Neuro/Psych PSYCHIATRIC DISORDERS Anxiety Depression Dementia negative neurological ROS     GI/Hepatic   Endo/Other    Renal/GU Renal disease (kidney stones)  negative genitourinary   Musculoskeletal negative musculoskeletal ROS (+)   Abdominal   Peds  Hematology negative hematology ROS (+)   Anesthesia Other Findings   Reproductive/Obstetrics negative OB ROS                           Anesthesia Physical Anesthesia Plan  ASA: 2  Anesthesia Plan: General   Post-op Pain Management:    Induction: Intravenous  PONV Risk Score and Plan: 2 and Treatment may vary due to age or medical condition, Ondansetron and Dexamethasone  Airway Management Planned: LMA  Additional Equipment: None  Intra-op Plan:   Post-operative Plan: Extubation in OR  Informed Consent: I have reviewed the patients History and Physical, chart, labs and discussed the procedure including the risks, benefits and alternatives for the proposed anesthesia with the patient or authorized representative who has indicated his/her understanding and acceptance.     Consent reviewed with POA and Dental advisory given  Plan Discussed with: CRNA, Anesthesiologist and Surgeon  Anesthesia Plan Comments: (Patient calm in preop. Will avoid midazolam given paradoxical reaction to ativan. GA/LMA.  Norton Blizzard, MD  )        Anesthesia Quick Evaluation

## 2021-08-13 ENCOUNTER — Ambulatory Visit (HOSPITAL_COMMUNITY): Payer: Medicare PPO | Admitting: Anesthesiology

## 2021-08-13 ENCOUNTER — Encounter (HOSPITAL_COMMUNITY): Payer: Self-pay | Admitting: Urology

## 2021-08-13 ENCOUNTER — Encounter (HOSPITAL_COMMUNITY)
Admission: RE | Disposition: A | Discharge status: Home / Self Care | Payer: Self-pay | Source: Home / Self Care | Attending: Urology

## 2021-08-13 ENCOUNTER — Observation Stay (HOSPITAL_COMMUNITY)
Admission: RE | Admit: 2021-08-13 | Discharge: 2021-08-14 | Disposition: A | Discharge status: Home / Self Care | Payer: Medicare PPO | Attending: Urology | Admitting: Urology

## 2021-08-13 ENCOUNTER — Ambulatory Visit (HOSPITAL_COMMUNITY): Payer: Medicare PPO | Admitting: Physician Assistant

## 2021-08-13 ENCOUNTER — Other Ambulatory Visit: Payer: Self-pay

## 2021-08-13 DIAGNOSIS — Z79899 Other long term (current) drug therapy: Secondary | ICD-10-CM | POA: Diagnosis not present

## 2021-08-13 DIAGNOSIS — Z23 Encounter for immunization: Secondary | ICD-10-CM | POA: Diagnosis not present

## 2021-08-13 DIAGNOSIS — N401 Enlarged prostate with lower urinary tract symptoms: Principal | ICD-10-CM | POA: Insufficient documentation

## 2021-08-13 DIAGNOSIS — N400 Enlarged prostate without lower urinary tract symptoms: Secondary | ICD-10-CM | POA: Diagnosis present

## 2021-08-13 HISTORY — PX: TRANSURETHRAL RESECTION OF PROSTATE: SHX73

## 2021-08-13 LAB — TYPE AND SCREEN
ABO/RH(D): O POS
Antibody Screen: NEGATIVE

## 2021-08-13 SURGERY — TURP (TRANSURETHRAL RESECTION OF PROSTATE)
Anesthesia: General

## 2021-08-13 MED ORDER — ONDANSETRON HCL 4 MG/2ML IJ SOLN: 4.0000 mg | INTRAMUSCULAR | Status: DC | PRN

## 2021-08-13 MED ORDER — LIDOCAINE HCL (PF) 2 % IJ SOLN
INTRAMUSCULAR | Status: AC
  Filled 2021-08-13: qty 5

## 2021-08-13 MED ORDER — SENNOSIDES-DOCUSATE SODIUM 8.6-50 MG PO TABS
2.0000 | ORAL_TABLET | Freq: Every day | ORAL | Status: DC
  Filled 2021-08-13: qty 2

## 2021-08-13 MED ORDER — HYDROMORPHONE HCL 1 MG/ML IJ SOLN
INTRAMUSCULAR | Status: AC
  Administered 2021-08-13: 0.25 mg via INTRAVENOUS
  Filled 2021-08-13: qty 2

## 2021-08-13 MED ORDER — OXYCODONE HCL 5 MG PO TABS
5.0000 mg | ORAL_TABLET | Freq: Once | ORAL | Status: AC | PRN
  Administered 2021-08-13: 5 mg via ORAL

## 2021-08-13 MED ORDER — ONDANSETRON HCL 4 MG/2ML IJ SOLN
INTRAMUSCULAR | Status: DC | PRN
  Administered 2021-08-13: 4 mg via INTRAVENOUS

## 2021-08-13 MED ORDER — HYDROCODONE-ACETAMINOPHEN 5-325 MG PO TABS
ORAL_TABLET | ORAL | Status: AC
  Filled 2021-08-13: qty 2

## 2021-08-13 MED ORDER — FENTANYL CITRATE PF 50 MCG/ML IJ SOSY
25.0000 ug | PREFILLED_SYRINGE | INTRAMUSCULAR | Status: DC | PRN
  Administered 2021-08-13 (×3): 50 ug via INTRAVENOUS

## 2021-08-13 MED ORDER — MORPHINE SULFATE (PF) 4 MG/ML IV SOLN
INTRAVENOUS | Status: AC
  Filled 2021-08-13: qty 1

## 2021-08-13 MED ORDER — QUETIAPINE FUMARATE 50 MG PO TABS
75.0000 mg | ORAL_TABLET | Freq: Every day | ORAL | Status: DC
  Administered 2021-08-13: 75 mg via ORAL
  Filled 2021-08-13: qty 1

## 2021-08-13 MED ORDER — SODIUM CHLORIDE 0.9 % IV SOLN
INTRAVENOUS | Status: DC
  Administered 2021-08-13: 1000 mL via INTRAVENOUS

## 2021-08-13 MED ORDER — HYDROCODONE-ACETAMINOPHEN 5-325 MG PO TABS
1.0000 | ORAL_TABLET | ORAL | Status: DC | PRN
  Administered 2021-08-13 (×2): 2 via ORAL
  Filled 2021-08-13: qty 2

## 2021-08-13 MED ORDER — ONDANSETRON HCL 4 MG/2ML IJ SOLN
INTRAMUSCULAR | Status: AC
  Filled 2021-08-13: qty 2

## 2021-08-13 MED ORDER — LACTATED RINGERS IV SOLN
INTRAVENOUS | Status: DC
  Administered 2021-08-13: 1000 mL via INTRAVENOUS

## 2021-08-13 MED ORDER — HYDROMORPHONE HCL 1 MG/ML IJ SOLN
0.2500 mg | INTRAMUSCULAR | Status: DC | PRN
  Administered 2021-08-13 (×6): 0.25 mg via INTRAVENOUS

## 2021-08-13 MED ORDER — ORAL CARE MOUTH RINSE
15.0000 mL | Freq: Once | OROMUCOSAL | Status: AC
  Administered 2021-08-13: 15 mL via OROMUCOSAL

## 2021-08-13 MED ORDER — POLYETHYLENE GLYCOL 3350 17 G PO PACK
17.0000 g | PACK | Freq: Every day | ORAL | Status: DC
  Administered 2021-08-14: 17 g via ORAL
  Filled 2021-08-13: qty 1

## 2021-08-13 MED ORDER — DEXAMETHASONE SODIUM PHOSPHATE 10 MG/ML IJ SOLN
INTRAMUSCULAR | Status: AC
  Filled 2021-08-13: qty 1

## 2021-08-13 MED ORDER — DEXMEDETOMIDINE (PRECEDEX) IN NS 20 MCG/5ML (4 MCG/ML) IV SYRINGE
PREFILLED_SYRINGE | INTRAVENOUS | Status: AC
  Filled 2021-08-13: qty 5

## 2021-08-13 MED ORDER — DIPHENHYDRAMINE HCL 12.5 MG/5ML PO ELIX: 12.5000 mg | ORAL_SOLUTION | Freq: Four times a day (QID) | ORAL | Status: DC | PRN

## 2021-08-13 MED ORDER — CHLORHEXIDINE GLUCONATE CLOTH 2 % EX PADS
6.0000 | MEDICATED_PAD | Freq: Every day | CUTANEOUS | Status: DC
  Administered 2021-08-14: 6 via TOPICAL

## 2021-08-13 MED ORDER — MELATONIN 5 MG PO TABS
10.0000 mg | ORAL_TABLET | Freq: Every day | ORAL | Status: DC
  Administered 2021-08-13: 10 mg via ORAL
  Filled 2021-08-13: qty 2

## 2021-08-13 MED ORDER — DEXMEDETOMIDINE (PRECEDEX) IN NS 20 MCG/5ML (4 MCG/ML) IV SYRINGE
PREFILLED_SYRINGE | INTRAVENOUS | Status: DC | PRN
  Administered 2021-08-13 (×2): 8 ug via INTRAVENOUS

## 2021-08-13 MED ORDER — DIAZEPAM 2 MG PO TABS: 10.0000 mg | ORAL_TABLET | Freq: Four times a day (QID) | ORAL | Status: DC | PRN

## 2021-08-13 MED ORDER — DIPHENHYDRAMINE HCL 50 MG/ML IJ SOLN: 12.5000 mg | Freq: Four times a day (QID) | INTRAMUSCULAR | Status: DC | PRN

## 2021-08-13 MED ORDER — ACETAMINOPHEN 500 MG PO TABS
500.0000 mg | ORAL_TABLET | Freq: Once | ORAL | Status: AC
  Administered 2021-08-13: 500 mg via ORAL
  Filled 2021-08-13: qty 1

## 2021-08-13 MED ORDER — EPHEDRINE SULFATE-NACL 50-0.9 MG/10ML-% IV SOSY
PREFILLED_SYRINGE | INTRAVENOUS | Status: DC | PRN
  Administered 2021-08-13 (×2): 10 mg via INTRAVENOUS

## 2021-08-13 MED ORDER — CEFAZOLIN SODIUM-DEXTROSE 2-4 GM/100ML-% IV SOLN
INTRAVENOUS | Status: AC
  Filled 2021-08-13: qty 100

## 2021-08-13 MED ORDER — INFLUENZA VAC A&B SA ADJ QUAD 0.5 ML IM PRSY
0.5000 mL | PREFILLED_SYRINGE | INTRAMUSCULAR | Status: DC
  Filled 2021-08-13: qty 0.5

## 2021-08-13 MED ORDER — OXYCODONE HCL 5 MG/5ML PO SOLN: 5.0000 mg | Freq: Once | ORAL | Status: AC | PRN

## 2021-08-13 MED ORDER — FENTANYL CITRATE (PF) 100 MCG/2ML IJ SOLN
INTRAMUSCULAR | Status: DC | PRN
  Administered 2021-08-13 (×4): 25 ug via INTRAVENOUS

## 2021-08-13 MED ORDER — ACETAMINOPHEN 325 MG PO TABS: 650.0000 mg | ORAL_TABLET | ORAL | Status: DC | PRN

## 2021-08-13 MED ORDER — OXYBUTYNIN CHLORIDE 5 MG PO TABS: 5.0000 mg | ORAL_TABLET | Freq: Three times a day (TID) | ORAL | Status: DC | PRN

## 2021-08-13 MED ORDER — OXYCODONE HCL 5 MG PO TABS
ORAL_TABLET | ORAL | Status: AC
  Filled 2021-08-13: qty 1

## 2021-08-13 MED ORDER — PROPOFOL 10 MG/ML IV BOLUS
INTRAVENOUS | Status: DC | PRN
  Administered 2021-08-13: 180 mg via INTRAVENOUS

## 2021-08-13 MED ORDER — BACITRACIN-NEOMYCIN-POLYMYXIN 400-5-5000 EX OINT: 1.0000 "application " | TOPICAL_OINTMENT | Freq: Three times a day (TID) | CUTANEOUS | Status: DC | PRN

## 2021-08-13 MED ORDER — 0.9 % SODIUM CHLORIDE (POUR BTL) OPTIME
TOPICAL | Status: DC | PRN
  Administered 2021-08-13: 1000 mL

## 2021-08-13 MED ORDER — QUETIAPINE FUMARATE 25 MG PO TABS
25.0000 mg | ORAL_TABLET | Freq: Once | ORAL | Status: AC
  Administered 2021-08-13: 25 mg via ORAL
  Filled 2021-08-13: qty 1

## 2021-08-13 MED ORDER — MIDAZOLAM HCL 2 MG/2ML IJ SOLN
INTRAMUSCULAR | Status: AC
  Filled 2021-08-13: qty 2

## 2021-08-13 MED ORDER — ONDANSETRON HCL 4 MG/2ML IJ SOLN: 4.0000 mg | Freq: Once | INTRAMUSCULAR | Status: DC | PRN

## 2021-08-13 MED ORDER — PROPOFOL 10 MG/ML IV BOLUS
INTRAVENOUS | Status: AC
  Filled 2021-08-13: qty 20

## 2021-08-13 MED ORDER — CHLORHEXIDINE GLUCONATE 0.12 % MT SOLN: 15.0000 mL | Freq: Once | OROMUCOSAL | Status: AC

## 2021-08-13 MED ORDER — DEXAMETHASONE SODIUM PHOSPHATE 10 MG/ML IJ SOLN
INTRAMUSCULAR | Status: DC | PRN
  Administered 2021-08-13: 10 mg via INTRAVENOUS

## 2021-08-13 MED ORDER — SODIUM CHLORIDE 0.9 % IR SOLN
Status: DC | PRN
  Administered 2021-08-13: 27000 mL

## 2021-08-13 MED ORDER — STERILE WATER FOR IRRIGATION IR SOLN
Status: DC | PRN
  Administered 2021-08-13: 500 mL

## 2021-08-13 MED ORDER — SODIUM CHLORIDE 0.9 % IR SOLN
3000.0000 mL | Status: DC
  Administered 2021-08-13 (×4): 3000 mL

## 2021-08-13 MED ORDER — FENTANYL CITRATE PF 50 MCG/ML IJ SOSY
PREFILLED_SYRINGE | INTRAMUSCULAR | Status: AC
  Filled 2021-08-13: qty 3

## 2021-08-13 MED ORDER — LIDOCAINE 2% (20 MG/ML) 5 ML SYRINGE
INTRAMUSCULAR | Status: DC | PRN
  Administered 2021-08-13: 60 mg via INTRAVENOUS

## 2021-08-13 MED ORDER — FENTANYL CITRATE (PF) 100 MCG/2ML IJ SOLN
INTRAMUSCULAR | Status: AC
  Filled 2021-08-13: qty 2

## 2021-08-13 MED ORDER — BELLADONNA ALKALOIDS-OPIUM 16.2-60 MG RE SUPP: 1.0000 | Freq: Four times a day (QID) | RECTAL | Status: DC | PRN

## 2021-08-13 MED ORDER — MEMANTINE HCL 10 MG PO TABS
10.0000 mg | ORAL_TABLET | Freq: Two times a day (BID) | ORAL | Status: DC
  Administered 2021-08-13 – 2021-08-14 (×2): 10 mg via ORAL
  Filled 2021-08-13 (×2): qty 1

## 2021-08-13 MED ORDER — PNEUMOCOCCAL VAC POLYVALENT 25 MCG/0.5ML IJ INJ
0.5000 mL | INJECTION | INTRAMUSCULAR | Status: DC
  Filled 2021-08-13: qty 0.5

## 2021-08-13 MED ORDER — CEFAZOLIN SODIUM-DEXTROSE 2-4 GM/100ML-% IV SOLN
2.0000 g | INTRAVENOUS | Status: AC
  Administered 2021-08-13: 2 g via INTRAVENOUS
  Filled 2021-08-13: qty 100

## 2021-08-13 MED ORDER — MORPHINE SULFATE (PF) 4 MG/ML IV SOLN
2.0000 mg | INTRAVENOUS | Status: DC | PRN
  Administered 2021-08-13: 4 mg via INTRAVENOUS
  Filled 2021-08-13: qty 1

## 2021-08-13 MED ORDER — FERROUS SULFATE 325 (65 FE) MG PO TABS
325.0000 mg | ORAL_TABLET | Freq: Every day | ORAL | Status: DC
  Administered 2021-08-14: 325 mg via ORAL
  Filled 2021-08-13: qty 1

## 2021-08-13 MED ORDER — CEFAZOLIN SODIUM-DEXTROSE 2-4 GM/100ML-% IV SOLN
2.0000 g | Freq: Three times a day (TID) | INTRAVENOUS | Status: AC
  Administered 2021-08-13 (×2): 2 g via INTRAVENOUS
  Filled 2021-08-13 (×2): qty 100

## 2021-08-13 SURGICAL SUPPLY — 20 items
BAG URINE DRAIN 2000ML AR STRL (UROLOGICAL SUPPLIES) ×2 IMPLANT
BAG URO CATCHER STRL LF (MISCELLANEOUS) ×2 IMPLANT
CATH FOLEY 3WAY 30CC 24FR (CATHETERS) ×1
CATH URTH STD 24FR FL 3W 2 (CATHETERS) ×1 IMPLANT
CLOTH BEACON ORANGE TIMEOUT ST (SAFETY) IMPLANT
DRAPE FOOT SWITCH (DRAPES) ×2 IMPLANT
ELECT REM PT RETURN 15FT ADLT (MISCELLANEOUS) IMPLANT
GLOVE SURG ENC MOIS LTX SZ7.5 (GLOVE) ×2 IMPLANT
GOWN STRL REUS W/TWL XL LVL3 (GOWN DISPOSABLE) ×2 IMPLANT
HOLDER FOLEY CATH W/STRAP (MISCELLANEOUS) ×2 IMPLANT
KIT TURNOVER KIT A (KITS) ×2 IMPLANT
LOOP CUT BIPOLAR 24F LRG (ELECTROSURGICAL) ×2 IMPLANT
MANIFOLD NEPTUNE II (INSTRUMENTS) ×2 IMPLANT
PACK CYSTO (CUSTOM PROCEDURE TRAY) ×2 IMPLANT
PENCIL SMOKE EVACUATOR (MISCELLANEOUS) IMPLANT
SYR 30ML LL (SYRINGE) ×2 IMPLANT
SYR TOOMEY IRRIG 70ML (MISCELLANEOUS) ×2
SYRINGE TOOMEY IRRIG 70ML (MISCELLANEOUS) ×1 IMPLANT
TUBING CONNECTING 10 (TUBING) ×2 IMPLANT
TUBING UROLOGY SET (TUBING) ×2 IMPLANT

## 2021-08-13 NOTE — Transfer of Care (Signed)
Immediate Anesthesia Transfer of Care Note  Patient: Mike Pennington  Procedure(s) Performed: TRANSURETHRAL RESECTION OF THE PROSTATE (TURP)  Patient Location: PACU  Anesthesia Type:General  Level of Consciousness: drowsy  Airway & Oxygen Therapy: Patient Spontanous Breathing and Patient connected to face mask oxygen  Post-op Assessment: Report given to RN and Post -op Vital signs reviewed and stable  Post vital signs: Reviewed and stable  Last Vitals:  Vitals Value Taken Time  BP    Temp 36.6 C 08/13/21 1000  Pulse 72 08/13/21 0959  Resp 14 08/13/21 1000  SpO2 100 % 08/13/21 0959  Vitals shown include unvalidated device data.  Last Pain:  Vitals:   08/13/21 0708  TempSrc: Oral         Complications: No notable events documented.

## 2021-08-13 NOTE — Anesthesia Procedure Notes (Signed)
Procedure Name: LMA Insertion Date/Time: 08/13/2021 8:36 AM Performed by: Sharlette Dense, CRNA Patient Re-evaluated:Patient Re-evaluated prior to induction Oxygen Delivery Method: Circle system utilized Preoxygenation: Pre-oxygenation with 100% oxygen Induction Type: IV induction LMA: LMA inserted LMA Size: 4.0 Number of attempts: 1 Placement Confirmation: positive ETCO2 and breath sounds checked- equal and bilateral Tube secured with: Tape Dental Injury: Teeth and Oropharynx as per pre-operative assessment

## 2021-08-13 NOTE — H&P (Signed)
CC/HPI: CC: Urinary retention  HPI:  07/24/2021  Patient has history of elevated PSA and underwent a prostate biopsy about 1 year ago by Dr. Alinda Money with negative pathology. Prostate size was 105 g. Over the past couple months he has required Foley catheter for urinary retention. He has failed voiding trials. He has significant cognitive dysfunction secondary to advancing dementia. He gets quite anxious in physicians offices. Speaking with him, his wife, and a Education officer, museum who was on the phone, it is not thought that he would tolerate a urodynamics due to severe anxiety nor would he do well with diagnostic cystoscopy in the office. He is quite troubled by the Foley catheter and it is quite irritating. He has had several urinary tract infections. He is currently on cefdinir for a UTI.     ALLERGIES: Ambien TABS Ativan    MEDICATIONS: Finasteride 5 mg tablet  Tamsulosin Hcl 0.4 mg capsule 1 capsule PO BID  Cefdinir  Colace 100 mg capsule  Metamucil  Miralax 17 gram powder in packet  Multiple Vitamins  Np Thyroid 90 mg tablet  Seroquel 25 mg tablet  Stool Softener  Supplements     GU PSH: Prostate Needle Biopsy - 09/01/2020     NON-GU PSH: Hand/finger Surgery Hip Replacement Surgical Pathology, Gross And Microscopic Examination For Prostate Needle - 09/01/2020     GU PMH: Elevated PSA - 06/29/2021, - 09/01/2020 Urinary Retention - 06/29/2021, - 06/27/2021, - 06/20/2021, - 06/04/2021, - 05/29/2021 BPH w/LUTS - 05/29/2021    NON-GU PMH: None   FAMILY HISTORY: Colon Cancer - Mother Death of family member - Father, Mother Kidney Stones - Runs in Family Myocardial Infarction - Father    Notes: 2 stepdaughters    SOCIAL HISTORY: Marital Status: Married Preferred Language: English; Ethnicity: Not Hispanic Or Latino; Race: White Current Smoking Status: Patient has never smoked.   Tobacco Use Assessment Completed: Used Tobacco in last 30 days? Has never drank.  Drinks 4+ caffeinated  drinks per day. Patient's occupation is/was retired.    REVIEW OF SYSTEMS:    GU Review Male:   Patient denies frequent urination, hard to postpone urination, burning/ pain with urination, get up at night to urinate, leakage of urine, stream starts and stops, trouble starting your stream, have to strain to urinate , erection problems, and penile pain.  Gastrointestinal (Upper):   Patient denies vomiting, indigestion/ heartburn, and nausea.  Gastrointestinal (Lower):   Patient denies diarrhea and constipation.  Constitutional:   Patient denies fever, night sweats, weight loss, and fatigue.  Skin:   Patient denies skin rash/ lesion and itching.  Eyes:   Patient denies blurred vision and double vision.  Ears/ Nose/ Throat:   Patient denies sore throat and sinus problems.  Hematologic/Lymphatic:   Patient denies swollen glands and easy bruising.  Cardiovascular:   Patient denies leg swelling and chest pains.  Respiratory:   Patient denies cough and shortness of breath.  Endocrine:   Patient denies excessive thirst.  Musculoskeletal:   Patient denies back pain and joint pain.  Neurological:   Patient denies headaches and dizziness.  Psychologic:   Patient denies depression and anxiety.   VITAL SIGNS: None   GU PHYSICAL EXAMINATION:    Penis: catheter draining clear yellow urine   MULTI-SYSTEM PHYSICAL EXAMINATION:    Constitutional: Well-nourished. No physical deformities. Normally developed. Good grooming.   Neurologic / Psychiatric: Cognitive impairment and anxious consistent with his history of dementia     Complexity of Data:  Source  Of History:  Patient  Records Review:   Pathology Reports, Previous Doctor Records, Previous Patient Records   PROCEDURES: None   ASSESSMENT:      ICD-10 Details  1 GU:   BPH w/LUTS - N40.1 Chronic, Stable  2   Urinary Retention - R33.8 Chronic, Stable  3   Elevated PSA - R97.20 Chronic, Stable   PLAN:           Document Letter(s):  Created  for Patient: Clinical Summary         Notes:   recommend against any further PSA screening/prostate cancer screening given his severe cognitive impairment and dementia.   The patient has failed medical management for his lower urinary tract symptoms. He would like to proceed with surgical resection. I discussed bipolar transurethral resection of the prostate. I specifically discussed the risks including but not limited to bleeding which could require blood transfusion, infection, and injury to surrounding structures. Also discussed the possibility that the surgery would not improve symptoms though most men have a great improvement in their symptoms. Also discussed the low likelihood but possibility of development of new symptoms such as irritative voiding symptoms or urinary incontinence. Most men will have some degree of urinary urgency and discomfort immediately following the surgery that resolves in a short amount of time. He understands that most often this is an outpatient procedure but occasionally patients require hospitalization for continuous bladder irrigation in the event of excess bleeding. He also understands the possibility of being sent home with a urethral catheter. The patient expressed understanding and is eager to proceed.   I specifically discussed the possibility of failure. Urodynamics was declined. I discussed the role of urodynamics and ensuring proper function of the bladder. However, they do not believe he would tolerate this. I think the most likely cause of his retention would be BPH given his prostate size rather than a nonfunctional bladder. High likelihood of success of TURP. May not do well with simple prostatectomy given his cognitive impairment. May transition to a suprapubic tube if he fails TURP. Also specifically discussed the risk of urinary incontinence after relief of outlet obstruction which he is at increased risk for given his advancing dementia.        Next  Appointment:      Next Appointment: 08/22/2021 08:30 AM    Appointment Type: Office Visit Established Patient    Location: [facility] [phone] [id number]    Provider: Raynelle Bring, M.D.    Reason for Visit: Next available recheck      Signed by Link Snuffer, III, M.D. on 07/24/21 at 9:04 AM (EDT

## 2021-08-13 NOTE — Op Note (Signed)
Preoperative diagnosis: Bladder outlet obstruction secondary to BPH  Postoperative diagnosis:  Bladder outlet obstruction secondary to BPH  Procedure:  Cystoscopy Transurethral resection of the prostate  Surgeon: Marton Redwood, III. M.D.  Anesthesia: General  Complications: None  EBL: Minimal  Specimens: Prostate chips  Indication: Mike Pennington is a patient with bladder outlet obstruction secondary to benign prostatic hyperplasia. After reviewing the management options for treatment, he elected to proceed with the above surgical procedure(s). We have discussed the potential benefits and risks of the procedure, side effects of the proposed treatment, the likelihood of the patient achieving the goals of the procedure, and any potential problems that might occur during the procedure or recuperation. Informed consent has been obtained.  Description of procedure:  The patient was taken to the operating room and general anesthesia was induced.  The patient was placed in the dorsal lithotomy position, prepped and draped in the usual sterile fashion, and preoperative antibiotics were administered. A preoperative time-out was performed.   Cystourethroscopy was performed.  The patient's urethra was examined and demonstrated bilobar prostatic hypertrophy.   The bladder was then systematically examined in its entirety. There was no evidence of any bladder tumors, stones, or other mucosal pathology.  The ureteral orifices were identified and marked so as to be avoided during the procedure.  The prostate adenoma was then resected utilizing loop cautery resection with the bipolar cutting loop.  The prostate adenoma from the bladder neck back to the verumontanum was resected beginning at the six o'clock position and then extended to include the right and left lobes of the prostate and anterior prostate. Care was taken not to resect distal to the verumontanum.  Hemostasis was then achieved with  the cautery and the bladder was emptied and reinspected with no significant bleeding noted at the end of the procedure.    A 24 French 3 way catheter was then placed into the bladder.  The patient appeared to tolerate the procedure well and without complications.  The patient was able to be awakened and transferred to the recovery unit in satisfactory condition.

## 2021-08-13 NOTE — Progress Notes (Signed)
Pt arrived to unit from PACU with wife at bedside. Pt transferred to unit bed, VS obtained and resting in bed. Shortly after, pt became restless and wanting to get up. This RN assisted pt out of bed and to ambulate in the hall with wife. Pt continues to pace, does not want to enter back into room, is difficult to redirect, and becoming agitated. This RN attempted to administer PRN morphine, but pt refused access to IV. PO pain medications administered instead, after much encouragement from this RN and wife. MD Gloriann Loan paged requesting 1x Seroquel and safety sitter order. Medications administered as ordered and am monitoring patient closely as this time.

## 2021-08-13 NOTE — Anesthesia Postprocedure Evaluation (Signed)
Anesthesia Post Note  Patient: Mike Pennington  Procedure(s) Performed: TRANSURETHRAL RESECTION OF THE PROSTATE (TURP)     Patient location during evaluation: PACU Anesthesia Type: General Level of consciousness: awake and patient uncooperative Pain management: pain level controlled Vital Signs Assessment: post-procedure vital signs reviewed and stable Respiratory status: spontaneous breathing and respiratory function stable Cardiovascular status: stable Postop Assessment: no apparent nausea or vomiting Anesthetic complications: no   No notable events documented.  Last Vitals:  Vitals:   08/13/21 1230 08/13/21 1330  BP: (!) 132/91   Pulse: 91   Resp: 14 16  Temp:    SpO2: 95%     Last Pain:  Vitals:   08/13/21 1330  TempSrc:   PainSc: Asleep                 Merlinda Frederick

## 2021-08-13 NOTE — Progress Notes (Addendum)
On call urologist contacted regarding patient continuing to be restless, agitated, and unable to be redirected, after administration of 1x Seroquel. Wife at bedside asking what additional options are. MD advised to give routine bedtime medications (Melatonin, Seroquel, Namenda) at 2030. Updated wife on plan and will pass on to night shift RN.

## 2021-08-13 NOTE — Interval H&P Note (Signed)
History and Physical Interval Note:  08/13/2021 8:10 AM  Mike Pennington  has presented today for surgery, with the diagnosis of BENIGN PROSTATE HYPERPLASIA.  The various methods of treatment have been discussed with the patient and family. After consideration of risks, benefits and other options for treatment, the patient has consented to  Procedure(s): TRANSURETHRAL RESECTION OF THE PROSTATE (TURP) (N/A) as a surgical intervention.  The patient's history has been reviewed, patient examined, no change in status, stable for surgery.  I have reviewed the patient's chart and labs.  Questions were answered to the patient's satisfaction.     Marton Redwood, III

## 2021-08-13 NOTE — Discharge Instructions (Signed)

## 2021-08-14 ENCOUNTER — Encounter (HOSPITAL_COMMUNITY): Payer: Self-pay | Admitting: Urology

## 2021-08-14 DIAGNOSIS — N401 Enlarged prostate with lower urinary tract symptoms: Secondary | ICD-10-CM | POA: Diagnosis not present

## 2021-08-14 LAB — SURGICAL PATHOLOGY

## 2021-08-14 MED ORDER — HYDROCODONE-ACETAMINOPHEN 5-325 MG PO TABS: 1.0000 | ORAL_TABLET | ORAL | 0 refills | Status: DC | PRN

## 2021-08-14 NOTE — Discharge Summary (Signed)
Physician Discharge Summary  Patient ID: Mike Pennington MRN: [id number] DOB/AGE: [birth year] 70 y.o.   Admit date: 08/13/2021 Discharge date: 08/14/2021   Admission Diagnoses:   Discharge Diagnoses:  Active Problems:   BPH (benign prostatic hyperplasia)     Discharged Condition: good   Hospital Course: Patient underwent TURP.  He remained overnight under observation.  The following day his urine was light red and he was stable for discharge.   Consults: None   Significant Diagnostic Studies: None   Treatments: surgery: TURP   Discharge Exam: Blood pressure 132/82, pulse 78, temperature 97.9 F (36.6 C), temperature source Oral, resp. rate 18, height 6' (1.829 m), weight 79.9 kg, SpO2 98 %. General appearance: alert no acute distress Adequate perfusion of extremities Nonlabored respiration Three-way Foley catheter in place draining light red urine off CBI   Disposition: Discharge disposition: 01-Home or Self Care             Discharge Instructions       No wound care   Complete by: As directed           Allergies as of 08/14/2021         Reactions    Ambien [zolpidem] Other (See Comments)    Erratic behavior    Ativan [lorazepam] Other (See Comments)    Per family member, aggression with ativan            Medication List       TAKE these medications     diazepam 10 MG tablet Commonly known as: VALIUM Take 10 mg by mouth every 6 (six) hours as needed for anxiety.    docusate sodium 100 MG capsule Commonly known as: Colace Take 1 capsule (100 mg total) by mouth 2 (two) times daily. What changed: when to take this    ferrous sulfate 325 (65 FE) MG tablet Take 325 mg by mouth daily with breakfast.    finasteride 5 MG tablet Commonly known as: PROSCAR Take 5 mg by mouth daily.    HYDROcodone-acetaminophen 5-325 MG tablet Commonly known as: Norco Take 1 tablet by mouth every 4 (four) hours as needed for moderate pain.    memantine 10 MG  tablet Commonly known as: NAMENDA Take 1 tablet (10 mg at night) for 2 weeks, then increase to 1 tablet (10 mg) twice a day What changed:  how much to take how to take this when to take this additional instructions    OVER THE COUNTER MEDICATION Take 1 capsule by mouth daily. Daily bowel capsule    polyethylene glycol 17 g packet Commonly known as: MIRALAX / GLYCOLAX Take 17 g by mouth daily. Please start if patient having constipation, and hold for diarrhea.    QUEtiapine 25 MG tablet Commonly known as: SEROQUEL Please take 25 mg (1 Tablet) oral daily in AM, and 50 mg (2 Tablets) oral at bedtime. What changed:  how much to take how to take this when to take this additional instructions    tamsulosin 0.4 MG Caps capsule Commonly known as: FLOMAX Take 0.4 mg by mouth 2 (two) times daily.    traMADol 50 MG tablet Commonly known as: ULTRAM Take 50 mg by mouth 2 (two) times daily as needed for pain.               Signed: Marton Redwood, III 08/14/2021, 7:51 AM

## 2021-08-14 NOTE — Progress Notes (Signed)
Able to get vitals at midnight only, patient gets agitated sometimes. Will let patient rest and will get the vitals early in the morning to minimize stimulation.

## 2021-08-14 NOTE — Progress Notes (Signed)
Pt to be discharged to home today. Pt's Wife given discharge teaching and Medication and schedules for these Medications. Pt's Wife also given foley care and changing to leg bag teaching. Understanding verbalized.

## 2021-08-14 NOTE — Hospital Discharge Follow-Up (Deleted)
Physician Discharge Summary  Patient ID: Mike Pennington MRN: [id number] DOB/AGE: 1951-02-23 70 y.o.  Admit date: 08/13/2021 Discharge date: 08/14/2021  Admission Diagnoses:  Discharge Diagnoses:  Active Problems:   BPH (benign prostatic hyperplasia)   Discharged Condition: good  Hospital Course: Patient underwent TURP.  He remained overnight under observation.  The following day his urine was light red and he was stable for discharge.  Consults: None  Significant Diagnostic Studies: None  Treatments: surgery: TURP  Discharge Exam: Blood pressure 132/82, pulse 78, temperature 97.9 F (36.6 C), temperature source Oral, resp. rate 18, height 6' (1.829 m), weight 79.9 kg, SpO2 98 %. General appearance: alert no acute distress Adequate perfusion of extremities Nonlabored respiration Three-way Foley catheter in place draining light red urine off CBI  Disposition: Discharge disposition: 01-Home or Self Care       Discharge Instructions     No wound care   Complete by: As directed       Allergies as of 08/14/2021       Reactions   Ambien [zolpidem] Other (See Comments)   Erratic behavior   Ativan [lorazepam] Other (See Comments)   Per family member, aggression with ativan        Medication List     TAKE these medications    diazepam 10 MG tablet Commonly known as: VALIUM Take 10 mg by mouth every 6 (six) hours as needed for anxiety.   docusate sodium 100 MG capsule Commonly known as: Colace Take 1 capsule (100 mg total) by mouth 2 (two) times daily. What changed: when to take this   ferrous sulfate 325 (65 FE) MG tablet Take 325 mg by mouth daily with breakfast.   finasteride 5 MG tablet Commonly known as: PROSCAR Take 5 mg by mouth daily.   HYDROcodone-acetaminophen 5-325 MG tablet Commonly known as: Norco Take 1 tablet by mouth every 4 (four) hours as needed for moderate pain.   memantine 10 MG tablet Commonly known as: NAMENDA Take 1  tablet (10 mg at night) for 2 weeks, then increase to 1 tablet (10 mg) twice a day What changed:  how much to take how to take this when to take this additional instructions   OVER THE COUNTER MEDICATION Take 1 capsule by mouth daily. Daily bowel capsule   polyethylene glycol 17 g packet Commonly known as: MIRALAX / GLYCOLAX Take 17 g by mouth daily. Please start if patient having constipation, and hold for diarrhea.   QUEtiapine 25 MG tablet Commonly known as: SEROQUEL Please take 25 mg (1 Tablet) oral daily in AM, and 50 mg (2 Tablets) oral at bedtime. What changed:  how much to take how to take this when to take this additional instructions   tamsulosin 0.4 MG Caps capsule Commonly known as: FLOMAX Take 0.4 mg by mouth 2 (two) times daily.   traMADol 50 MG tablet Commonly known as: ULTRAM Take 50 mg by mouth 2 (two) times daily as needed for pain.         Signed: Marton Redwood, III 08/14/2021, 7:51 AM

## 2021-08-16 ENCOUNTER — Telehealth: Payer: Self-pay | Admitting: Physician Assistant

## 2021-08-16 NOTE — Telephone Encounter (Signed)
Awaiting pshyciatry Lorenda Cahill to send list over

## 2021-08-16 NOTE — Telephone Encounter (Signed)
Mike Pennington from [employer] ([employer] [employer], [employer] ) called in regarding a referral that was supposed to be sent. Mike Pennington wife said at his last visit, [name] was supposed to send a referral for psychiatry for medication management/cocktail .

## 2021-08-17 NOTE — Telephone Encounter (Signed)
Sent message again to foster, he will send it Monday, he is not in the office today.`

## 2021-08-20 ENCOUNTER — Ambulatory Visit: Payer: Medicare PPO | Admitting: Physician Assistant

## 2021-08-20 ENCOUNTER — Other Ambulatory Visit: Payer: Self-pay | Admitting: Physician Assistant

## 2021-08-20 ENCOUNTER — Other Ambulatory Visit: Payer: Self-pay

## 2021-08-20 DIAGNOSIS — G300 Alzheimer's disease with early onset: Secondary | ICD-10-CM

## 2021-08-20 DIAGNOSIS — R251 Tremor, unspecified: Secondary | ICD-10-CM

## 2021-08-20 NOTE — Telephone Encounter (Signed)
Referral placed to [facility] psychiatry notified wife information given to follow up in a few days for scheduling. [phone], fax [phone].

## 2021-09-14 ENCOUNTER — Ambulatory Visit: Payer: Medicare PPO | Admitting: Physician Assistant

## 2021-10-01 ENCOUNTER — Other Ambulatory Visit: Payer: Self-pay | Admitting: Urology

## 2021-10-26 NOTE — Patient Instructions (Signed)
DUE TO COVID-19 ONLY ONE VISITOR IS ALLOWED TO COME WITH YOU AND STAY IN THE WAITING ROOM ONLY DURING PRE OP AND PROCEDURE DAY OF SURGERY IF YOU ARE GOING HOME AFTER SURGERY. IF YOU ARE SPENDING THE NIGHT 2 PEOPLE MAY VISIT WITH YOU IN YOUR PRIVATE ROOM AFTER SURGERY UNTIL VISITING  HOURS ARE OVER AT 800 PM AND 1  VISITOR  MAY  SPEND THE NIGHT.   YOU NEED TO HAVE A COVID 19 TEST ON_1/6_THIS TEST MUST BE DONE BEFORE SURGERY,  COVID TESTING SITE  IS LOCATED AT [place], [place]. REMAIN IN YOUR CAR THIS IS A DRIVE UP TEST. AFTER YOUR COVID TEST PLEASE WEAR A MASK OUT IN PUBLIC AND SOCIAL DISTANCE AND [place] YOUR HANDS FREQUENTLY, ALSO ASK ALL YOUR CLOSE CONTACT PERSONS TO WEAR A MASK AND SOCIAL DISTANCE AND [place] THEIR HANDS FREQUENTLY ALSO.               Chelsea Primus     Your procedure is scheduled on: 11/19/21   Report to [facility] Main  Entrance   Report to admitting at 11:15 AM     Call this number if you have problems the morning of surgery [phone]    Remember: Do not eat food or drink :After Midnight the night before your surgery,          BRUSH YOUR TEETH MORNING OF SURGERY AND RINSE YOUR MOUTH OUT, NO CHEWING GUM CANDY OR MINTS.     Take these medicines the morning of surgery with A SIP OF WATER: Sertraline, Seroquel, Finasteride, Tamsulosin, Armour                                You may not have any metal on your body including              piercings  Do not wear jewelry,  lotions, powders or  deodorant              Men may shave face and neck.   Do not bring valuables to the hospital. [place].  Contacts, dentures or bridgework may not be worn into surgery.  Leave suitcase in the car. After surgery it may be brought to your room.                 Please read over the following fact sheets you were given: _____________________________________________________________________              [facility] - Preparing for Surgery Before surgery, you can play an important role.  Because skin is not sterile, your skin needs to be as free of germs as possible.  You can reduce the number of germs on your skin by washing with CHG (chlorahexidine gluconate) soap before surgery.  CHG is an antiseptic cleaner which kills germs and bonds with the skin to continue killing germs even after washing. Please DO NOT use if you have an allergy to CHG or antibacterial soaps.  If your skin becomes reddened/irritated stop using the CHG and inform your nurse when you arrive at Short Stay. You may shave your face/neck. Please follow these instructions carefully:  1.  Shower with CHG Soap the night before surgery and the  morning of Surgery.  2.  If you choose to wash your hair,  wash your hair first as usual with your  normal  shampoo.  3.  After you shampoo, rinse your hair and body thoroughly to remove the  shampoo.                            4.  Use CHG as you would any other liquid soap.  You can apply chg directly  to the skin and wash                       Gently with a scrungie or clean washcloth.  5.  Apply the CHG Soap to your body ONLY FROM THE NECK DOWN.   Do not use on face/ open                           Wound or open sores. Avoid contact with eyes, ears mouth and genitals (private parts).                       Wash face,  Genitals (private parts) with your normal soap.             6.  Wash thoroughly, paying special attention to the area where your surgery  will be performed.  7.  Thoroughly rinse your body with warm water from the neck down.  8.  DO NOT shower/wash with your normal soap after using and rinsing off  the CHG Soap.                9.  Pat yourself dry with a clean towel.            10.  Wear clean pajamas.            11.  Place clean sheets on your bed the night of your first shower and do not  sleep with pets. Day of Surgery : Do not apply any lotions/deodorants the morning of  surgery.  Please wear clean clothes to the hospital/surgery center.  FAILURE TO FOLLOW THESE INSTRUCTIONS MAY RESULT IN THE CANCELLATION OF YOUR SURGERY PATIENT SIGNATURE_________________________________  NURSE SIGNATURE__________________________________  ________________________________________________________________________

## 2021-10-29 ENCOUNTER — Other Ambulatory Visit: Payer: Self-pay

## 2021-10-29 ENCOUNTER — Encounter (HOSPITAL_COMMUNITY): Payer: Self-pay

## 2021-10-29 ENCOUNTER — Encounter (HOSPITAL_COMMUNITY)
Admission: RE | Admit: 2021-10-29 | Discharge: 2021-10-29 | Disposition: A | Discharge status: Home / Self Care | Payer: Medicare PPO | Source: Ambulatory Visit | Attending: Urology | Admitting: Urology

## 2021-10-29 VITALS — BP 131/97 | HR 82 | Temp 98.4°F | Resp 16 | Ht 72.0 in | Wt 184.0 lb

## 2021-10-29 DIAGNOSIS — R338 Other retention of urine: Secondary | ICD-10-CM | POA: Insufficient documentation

## 2021-10-29 DIAGNOSIS — Z01812 Encounter for preprocedural laboratory examination: Secondary | ICD-10-CM | POA: Insufficient documentation

## 2021-10-29 DIAGNOSIS — G300 Alzheimer's disease with early onset: Secondary | ICD-10-CM | POA: Diagnosis not present

## 2021-10-29 DIAGNOSIS — F02818 Dementia in other diseases classified elsewhere, unspecified severity, with other behavioral disturbance: Secondary | ICD-10-CM | POA: Diagnosis not present

## 2021-10-29 DIAGNOSIS — N401 Enlarged prostate with lower urinary tract symptoms: Secondary | ICD-10-CM | POA: Insufficient documentation

## 2021-10-29 LAB — BASIC METABOLIC PANEL
Anion gap: 10 (ref 5–15)
BUN: 39 mg/dL — ABNORMAL HIGH (ref 8–23)
CO2: 23 mmol/L (ref 22–32)
Calcium: 9.6 mg/dL (ref 8.9–10.3)
Chloride: 107 mmol/L (ref 98–111)
Creatinine, Ser: 0.79 mg/dL (ref 0.61–1.24)
GFR, Estimated: >60 mL/min (ref >60)
Glucose, Bld: 102 mg/dL — ABNORMAL HIGH (ref 70–99)
Potassium: 5 mmol/L (ref 3.5–5.1)
Sodium: 140 mmol/L (ref 135–145)

## 2021-10-29 LAB — CBC
HCT: 49.3 % (ref 39.0–52.0)
Hemoglobin: 16.1 g/dL (ref 13.0–17.0)
MCH: 31.9 pg (ref 26.0–34.0)
MCHC: 32.7 g/dL (ref 30.0–36.0)
MCV: 97.8 fL (ref 80.0–100.0)
Platelets: 315 10*3/uL (ref 150–400)
RBC: 5.04 MIL/uL (ref 4.22–5.81)
RDW: 13.7 % (ref 11.5–15.5)
WBC: 5.9 10*3/uL (ref 4.0–10.5)
nRBC: 0 % (ref 0.0–0.2)

## 2021-10-29 NOTE — Progress Notes (Addendum)
Anesthesia Review:  PCP: DR Rudene Anda  Cardiologist : none  Chest x-ray : EKG :06/12/21  Echo : Stress test: Cardiac Cath :  Activity level: pt has Alzheimer's walks without difficulty  Sleep Study/ CPAP : none  Fasting Blood Sugar :      / Checks Blood Sugar -- times a day:   Blood Thinner/ Instructions /Last Dose: ASA / Instructions/ Last Dose :   PT has Alzheimer's .  PT can get agitated.  Need to speak softly to pt .  Wife needs to be with him as much as possible.   Wife asked if surgery time could be moved to earlier in day.  Preop nurse told her that she would call dr Alinda Money office and speak with surgery scheduler in regars to this.  Called and spoke with Coni Mabe at office and made her aware of pt's Alzheimer's and agitated state and Coni Mabe stated surgery could not be moved unless surgery date was at end of month .  Called and [facility] in regards to this.  Gave her phone number of [phone] ext [phone] for [facility] and my call back number of [phone].  Covid test on 11/15/2021.

## 2021-10-29 NOTE — Progress Notes (Signed)
DUE TO COVID-19 ONLY ONE VISITOR IS ALLOWED TO COME WITH YOU AND STAY IN THE WAITING ROOM ONLY DURING PRE OP AND PROCEDURE DAY OF SURGERY. THE 1 VISITOR  MAY VISIT WITH YOU AFTER SURGERY IN YOUR PRIVATE ROOM DURING VISITING HOURS ONLY!  YOU NEED TO HAVE A COVID 19 TEST ON__  11/15/2021 _____ @_______ , THIS TEST MUST BE DONE BEFORE SURGERY,  COVID TESTING SITE IS AT [facility]. PLEASE REMAIN IN YOUR CAR THIS IS A DRIVER UP TEST. AFTER YOUR COVID TEST PLEASE WEAR A MASK OUT IN PUBLIC AND SOCIAL DISTANCE AND [place] YOUR HANDS FREQUENTLY. PLEASE ASK ALL YOUR CLOSE CONTACTS TO WEAR A MASK OUT IN PUBLIC AND SOCIAL DISTANCE AND [place] HANDS FREQUENTLY ALSO.               MARQUE RADEMAKER  10/29/2021   Your procedure is scheduled on:   11/19/2021   Report to [facility] Main  Entrance   Report to admitting at   115 pm      Call this number if you have problems the morning of surgery [phone]    Remember: Do not eat food , candy gum or mints :After Midnight. You may have clear liquids from midnight until __  1230 pm   CLEAR LIQUID DIET   Foods Allowed                                                                       Coffee and tea, regular and decaf                              Plain Jell-O any favor except red or purple                                            Fruit ices (not with fruit pulp)                                      Iced Popsicles                                     Carbonated beverages, regular and diet                                    Cranberry, grape and apple juices Sports drinks like Gatorade Lightly seasoned clear broth or pmpmconsume(fat free) Sugar   _____________________________________________________________________    BRUSH YOUR TEETH MORNING OF SURGERY AND RINSE YOUR MOUTH OUT, NO CHEWING GUM CANDY OR MINTS.     Take these medicines the morning of surgery with A SIP OF WATER:      DO NOT TAKE ANY DIABETIC MEDICATIONS  DAY OF YOUR SURGERY  You may not have any metal on your body including hair pins and              piercings  Do not wear jewelry, make-up, lotions, powders or perfumes, deodorant             Do not wear nail polish on your fingernails.  Do not shave  48 hours prior to surgery.              Men may shave face and neck.   Do not bring valuables to the hospital. [place].  Contacts, dentures or bridgework may not be worn into surgery.  Leave suitcase in the car. After surgery it may be brought to your room.     Patients discharged the day of surgery will not be allowed to drive home. IF YOU ARE HAVING SURGERY AND GOING HOME THE SAME DAY, YOU MUST HAVE AN ADULT TO DRIVE YOU HOME AND BE WITH YOU FOR 24 HOURS. YOU MAY GO HOME BY TAXI OR UBER OR ORTHERWISE, BUT AN ADULT MUST ACCOMPANY YOU HOME AND STAY WITH YOU FOR 24 HOURS.  Name and phone number of your driver:  Special Instructions: N/A              Please read over the following fact sheets you were given: _____________________________________________________________________  [facility] - Preparing for Surgery Before surgery, you can play an important role.  Because skin is not sterile, your skin needs to be as free of germs as possible.  You can reduce the number of germs on your skin by washing with CHG (chlorahexidine gluconate) soap before surgery.  CHG is an antiseptic cleaner which kills germs and bonds with the skin to continue killing germs even after washing. Please DO NOT use if you have an allergy to CHG or antibacterial soaps.  If your skin becomes reddened/irritated stop using the CHG and inform your nurse when you arrive at Short Stay. Do not shave (including legs and underarms) for at least 48 hours prior to the first CHG shower.  You may shave your face/neck. Please follow these instructions carefully:  1.  Shower with CHG Soap the night before  surgery and the  morning of Surgery.  2.  If you choose to wash your hair, wash your hair first as usual with your  normal  shampoo.  3.  After you shampoo, rinse your hair and body thoroughly to remove the  shampoo.                           4.  Use CHG as you would any other liquid soap.  You can apply chg directly  to the skin and wash                       Gently with a scrungie or clean washcloth.  5.  Apply the CHG Soap to your body ONLY FROM THE NECK DOWN.   Do not use on face/ open                           Wound or open sores. Avoid contact with eyes, ears mouth and genitals (private parts).  Wash face,  Genitals (private parts) with your normal soap.             6.  Wash thoroughly, paying special attention to the area where your surgery  will be performed.  7.  Thoroughly rinse your body with warm water from the neck down.  8.  DO NOT shower/wash with your normal soap after using and rinsing off  the CHG Soap.                9.  Pat yourself dry with a clean towel.            10.  Wear clean pajamas.            11.  Place clean sheets on your bed the night of your first shower and do not  sleep with pets. Day of Surgery : Do not apply any lotions/deodorants the morning of surgery.  Please wear clean clothes to the hospital/surgery center.  FAILURE TO FOLLOW THESE INSTRUCTIONS MAY RESULT IN THE CANCELLATION OF YOUR SURGERY PATIENT SIGNATURE_________________________________  NURSE SIGNATURE__________________________________  ________________________________________________________________________

## 2021-11-01 ENCOUNTER — Encounter (HOSPITAL_COMMUNITY): Payer: Self-pay | Admitting: Emergency Medicine

## 2021-11-01 ENCOUNTER — Other Ambulatory Visit: Payer: Self-pay

## 2021-11-01 ENCOUNTER — Emergency Department (HOSPITAL_COMMUNITY)
Admission: EM | Admit: 2021-11-01 | Discharge: 2021-11-01 | Disposition: A | Discharge status: Home / Self Care | Payer: Medicare PPO | Attending: Emergency Medicine | Admitting: Emergency Medicine

## 2021-11-01 DIAGNOSIS — F0390 Unspecified dementia without behavioral disturbance: Secondary | ICD-10-CM | POA: Insufficient documentation

## 2021-11-01 DIAGNOSIS — Z8616 Personal history of COVID-19: Secondary | ICD-10-CM | POA: Diagnosis not present

## 2021-11-01 DIAGNOSIS — T83091A Other mechanical complication of indwelling urethral catheter, initial encounter: Secondary | ICD-10-CM | POA: Diagnosis not present

## 2021-11-01 DIAGNOSIS — Z79899 Other long term (current) drug therapy: Secondary | ICD-10-CM | POA: Insufficient documentation

## 2021-11-01 DIAGNOSIS — Z96641 Presence of right artificial hip joint: Secondary | ICD-10-CM | POA: Diagnosis not present

## 2021-11-01 DIAGNOSIS — T839XXA Unspecified complication of genitourinary prosthetic device, implant and graft, initial encounter: Secondary | ICD-10-CM

## 2021-11-01 DIAGNOSIS — R338 Other retention of urine: Secondary | ICD-10-CM

## 2021-11-01 DIAGNOSIS — E039 Hypothyroidism, unspecified: Secondary | ICD-10-CM | POA: Insufficient documentation

## 2021-11-01 DIAGNOSIS — R339 Retention of urine, unspecified: Secondary | ICD-10-CM | POA: Insufficient documentation

## 2021-11-01 LAB — URINALYSIS, ROUTINE W REFLEX MICROSCOPIC
Bilirubin Urine: NEGATIVE
Glucose, UA: NEGATIVE mg/dL
Ketones, ur: NEGATIVE mg/dL
Nitrite: POSITIVE — AB
RBC / HPF: >50 RBC/hpf — ABNORMAL HIGH (ref 0–5)
Specific Gravity, Urine: >=1.03 (ref 1.005–1.030)
WBC, UA: >50 WBC/hpf — ABNORMAL HIGH (ref 0–5)
pH: 6 (ref 5.0–8.0)

## 2021-11-01 MED ORDER — PROPOFOL 10 MG/ML IV BOLUS
50.0000 mg | Freq: Once | INTRAVENOUS | Status: AC
  Administered 2021-11-01: 14:00:00 50 mg via INTRAVENOUS
  Filled 2021-11-01: qty 20

## 2021-11-01 NOTE — ED Triage Notes (Signed)
Patient presents with wife sent from urology. Patient has had a foley since July and was having it replaced. Staff attempted to replace and patient became combative. Wife states they were sent here to sedate pt to have foley placed. Patient took a valium at 10am prior to appt. Wife states since Thanksgiving pt has been c/o penis pain and bladder pain.

## 2021-11-01 NOTE — ED Provider Notes (Signed)
[facility] DEPT Provider Note   CSN: [id number] Arrival date & time: 11/01/21  1148     History Chief Complaint  Patient presents with   Urinary Retention    Mike Pennington is a 70 y.o. male.  HPI    Level 5 caveat for dementia.  70 year old comes in with chief complaint of Foley catheter complication.  Patient has acute urinary retention.  He had gone to urology clinic where his Foley catheter was removed, the team was not able to replace Foley catheter given patient's agitation.  Patient has history of dementia.  Here with his wife and daughter.  Wife reports that patient has been having some pain in the lower quadrants recently.  No fevers or chills.  Patient is supposed to get suprapubic catheter placed sometime in January.  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 No pertinent family history.  Social History   Tobacco Use   Smoking status: Never   Smokeless tobacco: Never  Vaping Use   Vaping Use: Never used  Substance Use Topics   Alcohol use: Never   Drug use: Never    Home Medications Prior to Admission medications   Medication Sig Start Date End Date Taking? Authorizing Provider   acetaminophen (TYLENOL) 325 MG tablet Take 650 mg by mouth every 6 (six) hours as needed for moderate pain.    [provider]  docusate sodium (COLACE) 100 MG capsule Take 1 capsule (100 mg total) by mouth 2 (two) times daily. Patient not taking: Reported on 10/25/2021 06/14/21 06/14/22  Elgergawy, Silver Huguenin, MD  Ferrous Sulfate 90 (18 Fe) MG TABS Take 18 mg by mouth daily.    [provider]  finasteride (PROSCAR) 5 MG tablet Take 5 mg by mouth daily.    [provider]  Melatonin 10 MG TABS Take by mouth.    [provider]  memantine (NAMENDA) 10 MG tablet Take 1 tablet (10 mg at night) for 2 weeks, then increase to 1 tablet (10 mg) twice a day Patient taking differently: Take 10 mg by mouth 2 (two) times daily. 07/04/21   Rondel Jumbo, PA-C  polyethylene glycol (MIRALAX / GLYCOLAX) 17 g packet Take 17 g by mouth daily. Please start if patient having constipation, and hold for diarrhea. Patient taking differently: Take 8.5 g by mouth daily. 06/15/21   Elgergawy, Silver Huguenin, MD  Probiotic Product (SOLUBLE FIBER/PROBIOTICS PO) Take 2 tablets by mouth daily.    [provider]  QUEtiapine (SEROQUEL) 25 MG tablet Please take 25 mg (1 Tablet) oral daily in AM, and 50 mg (2 Tablets) oral at bedtime. Patient taking differently: Take 50 mg by mouth 2 (  two) times daily. 06/14/21   Elgergawy, Silver Huguenin, MD  senna (SENOKOT) 8.6 MG tablet Take 2 tablets by mouth daily.    [provider]  sertraline (ZOLOFT) 50 MG tablet Take 50 mg by mouth daily.    [provider]  tamsulosin (FLOMAX) 0.4 MG CAPS capsule Take 0.4 mg by mouth 2 (two) times daily. 05/29/21   [provider]  thyroid (ARMOUR) 90 MG tablet Take 90 mg by mouth daily.    [provider]    Allergies    Ambien [zolpidem] and Ativan [lorazepam]  Review of Systems   Review of Systems  Unable to perform ROS: Dementia   Physical Exam Updated Vital Signs BP 117/90 (BP  Location: Left Arm)    Pulse 66    Temp 98 F (36.7 C) (Oral)    Resp 15    Ht 6' (1.829 m)    Wt 83.5 kg    SpO2 100%    BMI 24.95 kg/m   Physical Exam Vitals and nursing note reviewed.  Constitutional:      Appearance: He is well-developed.  HENT:     Head: Atraumatic.  Cardiovascular:     Rate and Rhythm: Normal rate.  Pulmonary:     Effort: Pulmonary effort is normal.  Musculoskeletal:     Cervical back: Neck supple.  Skin:    General: Skin is warm.  Neurological:     Mental Status: He is alert and oriented to person, place, and time.    ED Results / Procedures / Treatments   Labs (all labs ordered are listed, but only abnormal results are displayed) Labs Reviewed  URINE CULTURE  URINALYSIS, ROUTINE W REFLEX MICROSCOPIC    EKG None  Radiology No results found.  Procedures .Sedation  Date/Time: 11/01/2021 3:10 PM Performed by: Varney Biles, MD Authorized by: Varney Biles, MD   Consent:    Consent obtained:  Written   Consent given by:  Patient   Risks discussed:  Vomiting, nausea, respiratory compromise necessitating ventilatory assistance and intubation, prolonged sedation necessitating reversal, prolonged hypoxia resulting in organ damage, dysrhythmia, allergic reaction and inadequate sedation   Alternatives discussed:  Analgesia without sedation Universal protocol:    Procedure explained and questions answered to patient or proxy's satisfaction: yes     Immediately prior to procedure, a time out was called: yes     Patient identity confirmed:  Arm band Indications:    Sedation is required to allow for: [place]. Pre-sedation assessment:    Time since last food or drink:  3.5   ASA classification: class 3 - patient with severe systemic disease     Mouth opening:  3 or more finger widths   Thyromental distance:  4 finger widths   Mallampati score:  II - soft palate, uvula, fauces visible   Pre-sedation assessments  completed and reviewed: airway patency, cardiovascular function, hydration status, mental status, nausea/vomiting, pain level, respiratory function and temperature     Pre-sedation assessment completed:  11/01/2021 12:12 PM Immediate pre-procedure details:    Reassessment: Patient reassessed immediately prior to procedure     Reviewed: vital signs, relevant labs/tests and NPO status     Verified: bag valve mask available   Procedure details (see MAR for exact dosages):    Sedation:  Propofol   Intended level of sedation: deep   Intra-procedure monitoring:  Blood pressure monitoring, continuous capnometry, frequent LOC assessments, frequent vital sign checks, continuous pulse oximetry and cardiac monitor  Intra-procedure events: none     Total Provider sedation time (minutes):  21 Post-procedure details:    Post-sedation assessment completed:  11/01/2021 3:13 PM   Attendance: Constant attendance by certified staff until patient recovered     Recovery: Patient returned to pre-procedure baseline     Post-sedation assessments completed and reviewed: airway patency, cardiovascular function, hydration status, mental status, nausea/vomiting, pain level, respiratory function and temperature     Patient is stable for discharge or admission: yes     Procedure completion:  Tolerated well, no immediate complications   Medications Ordered in ED Medications  propofol (DIPRIVAN) 10 mg/mL bolus/IV push 50 mg (50 mg Intravenous Given 11/01/21 1353)    ED Course  I have reviewed the triage vital signs and the nursing notes.  Pertinent labs & imaging results that were available during my care of the patient were reviewed by me and considered in my medical decision making (see chart for details).  Clinical Course as of 11/01/21 1504  Thu Nov 01, 2021  1502 Patient reassessed.  Back to baseline normal.  Wife -they would like to be discharged if possible.  Urine culture sent and results pending.  I have  sent a secure chat message to [facility] from urology to ensure they follow-up on the urine culture. [AN]    Clinical Course User Index [AN] Varney Biles, MD   MDM Rules/Calculators/A&P                           70 year old male comes in with chief complaint of acute urinary retention.  Patient requires Foley catheter placement under anesthesia given his history of dementia and significant agitation while at the urology office.  They attempted to get the Foley catheter placed, but failed, and it was decided it will be best for patient to get the procedure done under anesthesia.  Patient sent to the ED, family consented for the procedure and we completed the procedure without any complication.  Foley catheter-16 [place] placed without any issues.  Urine culture sent.     Final Clinical Impression(s) / ED Diagnoses Final diagnoses:  Complication of Foley catheter, initial encounter ([place])  Acute urinary retention    Rx / DC Orders ED Discharge Orders     None        Varney Biles, MD 11/01/21 1514

## 2021-11-01 NOTE — Discharge Instructions (Addendum)
We saw in the ER for placement of Foley catheter under sedation.  No complications from the procedure.  Urine cultures have been sent.  Continue expectant care with urology.

## 2021-11-03 LAB — URINE CULTURE: Culture: 20000 — AB

## 2021-11-04 ENCOUNTER — Telehealth: Payer: Self-pay | Admitting: Emergency Medicine

## 2021-11-04 NOTE — Telephone Encounter (Signed)
Post ED Visit - Positive Culture Follow-up  Culture report reviewed by antimicrobial stewardship pharmacist: [place] Team []  Elenor Quinones, Pharm.D. []  Heide Guile, Pharm.D., BCPS AQ-ID []  Parks Neptune, Pharm.D., BCPS []  Alycia Rossetti, Pharm.D., BCPS []  [place], Pharm.D., BCPS, AAHIVP []  Legrand Como, Pharm.D., BCPS, AAHIVP []  Salome Arnt, PharmD, BCPS []  Johnnette Gourd, PharmD, BCPS []  Hughes Better, PharmD, BCPS []  Leeroy Cha, PharmD []  Laqueta Linden, PharmD, BCPS []  Albertina Parr, PharmD  [facility] Team []  Leodis Sias, PharmD []  Lindell Spar, PharmD []  Royetta Asal, PharmD []  Graylin Shiver, Rph []  Rema Fendt) Glennon Mac, PharmD []  Arlyn Dunning, PharmD []  Netta Cedars, PharmD []  Dia Sitter, PharmD []  Leone Haven, PharmD [x]  Gretta Arab, PharmD []  Theodis Shove, PharmD []  Peggyann Juba, PharmD []  Reuel Boom, PharmD   Positive urine culture Treated with none, 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  Hazle Nordmann 11/04/2021, 12:08 PM

## 2021-11-06 ENCOUNTER — Encounter (HOSPITAL_COMMUNITY): Payer: Medicare PPO

## 2021-11-08 ENCOUNTER — Telehealth: Payer: Self-pay | Admitting: Physician Assistant

## 2021-11-08 ENCOUNTER — Other Ambulatory Visit (HOSPITAL_COMMUNITY): Payer: Medicare PPO

## 2021-11-08 NOTE — Telephone Encounter (Signed)
Pt's wife called in stating they have been having an issue with running out of the memantine too early the last few months. She stated the pharmacy will not refill it yet. She would like to find out what the issue is

## 2021-11-08 NOTE — Telephone Encounter (Signed)
Spoke to pt wife an informed her that I called the pharmacy and they are going to refill his medication with the 60 tablets today so they can go pick it up

## 2021-11-16 NOTE — H&P (Signed)
1. Elevated PSA  2. Urinary retention presumably related to BPH   [name] is a 71 year old gentleman with a history of significant dementia who had been followed by Dr. Jonette Eva in [facility] for history of BPH treated with finasteride and an elevated PSA. He had presented to our office in October 2021 when he underwent a MR/ultrasound fusion biopsy at which time his elevated PSA was 8.35. He had a PI-RADS 3 lesion in the right mid peripheral zone. Fortunately, all 18 biopsies were benign. According to his wife, he was voiding at his baseline on finasteride 5 mg until July 2022 at which time he developed acute urinary retention requiring urethral catheter to be placed in the emergency department. He was seen by Daine Gravel, NP and underwent multiple voiding trials which he failed despite treatment with tamsulosin 0.8 mg. There was an attempt for him to undergo urodynamics but he was not able to comply with this due to his severe dementia. He ultimately saw Dr. Alen Blew and after further discussion elected to proceed with a transurethral resection of the prostate. It was understood that success would be difficult to determine until after the procedure considering the inability to perform preoperative urodynamics. This was performed 1 month ago. Unfortunately, he has not been able to void since then having remained on both finasteride and tamsulosin and attempts at multiple voiding trials. He was seen today and was worked in while I was on call. However, it appears that he is not having any acute issues but rather that his wife simply wanted to get him worked in to see me for ongoing care. He currently has an indwelling catheter that was changed 1 week ago. He continues to draining clear urine. Because of his severe dementia, he has struggled with a catheter as it is new to him each day. During his visit today, Dr. Anthoney Harada also joined via telephone.     ALLERGIES: Ambien TABS Ativan     MEDICATIONS: Finasteride 5 mg tablet  Tamsulosin Hcl 0.4 mg capsule 1 capsule PO BID  Cefdinir  Colace 100 mg capsule  Metamucil  Miralax 17 gram powder in packet  Multiple Vitamins  Nemantine  Np Thyroid 90 mg tablet  Seroquel 25 mg tablet  Stool Softener  Supplements  Zoloft     GU PSH: Cystoscopy TURP - 08/13/2021 Prostate Needle Biopsy - 09/01/2020     NON-GU PSH: Hand/finger Surgery Hip Replacement Surgical Pathology, Gross And Microscopic Examination For Prostate Needle - 09/01/2020     GU PMH: Urinary Retention - 09/11/2021, - 08/22/2021, - 08/22/2021, - 08/16/2021, - 08/01/2021, - 07/24/2021, - 06/29/2021, - 06/27/2021, - 06/20/2021, - 06/04/2021, - 05/29/2021 BPH w/LUTS - 08/16/2021, - 07/24/2021, - 05/29/2021 Elevated PSA - 07/24/2021, - 06/29/2021, - 09/01/2020    NON-GU PMH: No Non-GU PMH    FAMILY HISTORY: Colon Cancer - Mother Death of family member - Father, Mother Kidney Stones - Runs in Family Myocardial Infarction - Father    Notes: 2 stepdaughters    SOCIAL HISTORY: Marital Status: Married Preferred Language: English; Ethnicity: Not Hispanic Or Latino; Race: White Current Smoking Status: Patient has never smoked.   Tobacco Use Assessment Completed: Used Tobacco in last 30 days? Has never drank.  Drinks 4+ caffeinated drinks per day. Patient's occupation is/was retired.    REVIEW OF SYSTEMS:    GU Review Male:   Patient denies frequent urination, hard to postpone urination, burning/ pain with urination, get up at night to urinate, leakage  of urine, stream starts and stops, trouble starting your streams, and have to strain to urinate .  Gastrointestinal (Lower):   Patient denies diarrhea and constipation.  Gastrointestinal (Upper):   Patient denies nausea and vomiting.  Constitutional:   Patient denies fever, night sweats, weight loss, and fatigue.  Skin:   Patient denies skin rash/ lesion and itching.  Eyes:   Patient denies blurred vision and double  vision.  Ears/ Nose/ Throat:   Patient denies sore throat and sinus problems.  Hematologic/Lymphatic:   Patient denies easy bruising and swollen glands.  Cardiovascular:   Patient denies leg swelling and chest pains.  Respiratory:   Patient denies cough and shortness of breath.  Endocrine:   Patient denies excessive thirst.  Musculoskeletal:   Patient denies back pain and joint pain.  Neurological:   Patient denies headaches and dizziness.  Psychologic:   Patient denies depression and anxiety.   VITAL SIGNS:     Weight 175 lb / 79.38 kg  Height 72 in / 182.88 cm  BMI 23.7 kg/m   GU PHYSICAL EXAMINATION:      Notes: He has an indwelling Foley catheter with grossly clear urine.   MULTI-SYSTEM PHYSICAL EXAMINATION:    Constitutional: He is aware and pleasant. He can answer questions appropriately.  Respiratory: No labored breathing, no use of accessory muscles. Clear bilaterally.  Cardiovascular: Normal temperature, normal extremity pulses, no swelling, no varicosities. Regular rate and rhythm.      ASSESSMENT:      ICD-10 Details  1 GU:   Urinary Retention - R33.8   2   BPH w/LUTS - N40.1    PLAN:      1. Urinary retention: I had a detailed discussion with Mr. and Mrs. Gilland. He appears to have persistent urinary retention following a TURP. We did discuss that it still may be somewhat premature to determine whether his TURP is a failure and that he does not have detrusor function although I am not particularly optimistic. His wife is quite adamant that I now assume his care. We therefore reviewed options and I recommended that we consider cystoscopy with suprapubic tube placement with the possibility of further transurethral resection if additional tissue is present that would appear beneficial. However, if there is not significant remaining tissue, I would simply plan to place a suprapubic tube under cystoscopic guidance. We reviewed these procedures in detail including the  potential risks, complications, and the expected recovery process. With regard to timing, it is likely that I would not be able to proceed until at least early January. May be beneficial for him to have follow-up in mid December for a urine culture for preoperative evaluation and another voiding trial 1 last time. If he fails that voiding trial, we will plan to tentatively have him scheduled for cystoscopy, suprapubic tube placement, and possible transurethral resection of remaining prostate tissue if indicated.

## 2021-11-17 ENCOUNTER — Other Ambulatory Visit (HOSPITAL_BASED_OUTPATIENT_CLINIC_OR_DEPARTMENT_OTHER): Payer: Self-pay | Admitting: Internal Medicine

## 2021-11-17 ENCOUNTER — Other Ambulatory Visit: Payer: Self-pay

## 2021-11-17 ENCOUNTER — Ambulatory Visit (HOSPITAL_BASED_OUTPATIENT_CLINIC_OR_DEPARTMENT_OTHER)
Admission: RE | Admit: 2021-11-17 | Discharge: 2021-11-17 | Disposition: A | Discharge status: Home / Self Care | Payer: Medicare PPO | Source: Ambulatory Visit | Attending: Internal Medicine | Admitting: Internal Medicine

## 2021-11-17 DIAGNOSIS — A159 Respiratory tuberculosis unspecified: Secondary | ICD-10-CM

## 2021-11-19 ENCOUNTER — Observation Stay (HOSPITAL_COMMUNITY)
Admission: RE | Admit: 2021-11-19 | Discharge: 2021-11-20 | Disposition: A | Discharge status: Home / Self Care | Payer: Medicare PPO | Attending: Urology | Admitting: Urology

## 2021-11-19 ENCOUNTER — Ambulatory Visit (HOSPITAL_COMMUNITY): Payer: Medicare PPO | Admitting: Anesthesiology

## 2021-11-19 ENCOUNTER — Encounter (HOSPITAL_COMMUNITY): Payer: Self-pay | Admitting: Urology

## 2021-11-19 ENCOUNTER — Ambulatory Visit (HOSPITAL_COMMUNITY): Payer: Medicare PPO | Admitting: Physician Assistant

## 2021-11-19 ENCOUNTER — Encounter (HOSPITAL_COMMUNITY)
Admission: RE | Disposition: A | Discharge status: Home / Self Care | Payer: Self-pay | Source: Home / Self Care | Attending: Urology

## 2021-11-19 ENCOUNTER — Other Ambulatory Visit: Payer: Self-pay

## 2021-11-19 DIAGNOSIS — F0390 Unspecified dementia without behavioral disturbance: Secondary | ICD-10-CM | POA: Insufficient documentation

## 2021-11-19 DIAGNOSIS — R338 Other retention of urine: Secondary | ICD-10-CM | POA: Insufficient documentation

## 2021-11-19 DIAGNOSIS — Z79899 Other long term (current) drug therapy: Secondary | ICD-10-CM | POA: Diagnosis not present

## 2021-11-19 DIAGNOSIS — Z20822 Contact with and (suspected) exposure to covid-19: Secondary | ICD-10-CM | POA: Diagnosis not present

## 2021-11-19 DIAGNOSIS — R339 Retention of urine, unspecified: Secondary | ICD-10-CM | POA: Diagnosis present

## 2021-11-19 DIAGNOSIS — N401 Enlarged prostate with lower urinary tract symptoms: Principal | ICD-10-CM | POA: Insufficient documentation

## 2021-11-19 DIAGNOSIS — N400 Enlarged prostate without lower urinary tract symptoms: Secondary | ICD-10-CM | POA: Diagnosis present

## 2021-11-19 HISTORY — PX: CYSTOSCOPY: SHX5120

## 2021-11-19 LAB — SARS CORONAVIRUS 2 BY RT PCR (HOSPITAL ORDER, PERFORMED IN ~~LOC~~ HOSPITAL LAB): SARS Coronavirus 2: NEGATIVE

## 2021-11-19 SURGERY — CYSTOSCOPY
Anesthesia: General

## 2021-11-19 MED ORDER — SODIUM CHLORIDE 0.9 % IV SOLN: 250.0000 mL | INTRAVENOUS | Status: DC | PRN

## 2021-11-19 MED ORDER — ONDANSETRON HCL 4 MG/2ML IJ SOLN
INTRAMUSCULAR | Status: AC
  Filled 2021-11-19: qty 2

## 2021-11-19 MED ORDER — CHLORHEXIDINE GLUCONATE 0.12 % MT SOLN: 15.0000 mL | Freq: Once | OROMUCOSAL | Status: DC

## 2021-11-19 MED ORDER — TRAMADOL HCL 50 MG PO TABS
50.0000 mg | ORAL_TABLET | Freq: Four times a day (QID) | ORAL | Status: DC | PRN
  Administered 2021-11-20: 50 mg via ORAL

## 2021-11-19 MED ORDER — DEXAMETHASONE SODIUM PHOSPHATE 10 MG/ML IJ SOLN
INTRAMUSCULAR | Status: AC
  Filled 2021-11-19: qty 1

## 2021-11-19 MED ORDER — SODIUM CHLORIDE 0.9% FLUSH: 3.0000 mL | Freq: Two times a day (BID) | INTRAVENOUS | Status: DC

## 2021-11-19 MED ORDER — OXYCODONE HCL 5 MG/5ML PO SOLN: 5.0000 mg | Freq: Once | ORAL | Status: DC | PRN

## 2021-11-19 MED ORDER — ACETAMINOPHEN 500 MG PO TABS
1000.0000 mg | ORAL_TABLET | Freq: Once | ORAL | Status: AC
Start: 2021-11-19 — End: 2021-11-19
  Administered 2021-11-19: 1000 mg via ORAL
  Filled 2021-11-19: qty 2

## 2021-11-19 MED ORDER — ONDANSETRON HCL 4 MG/2ML IJ SOLN
INTRAMUSCULAR | Status: DC | PRN
  Administered 2021-11-19: 4 mg via INTRAVENOUS

## 2021-11-19 MED ORDER — FENTANYL CITRATE PF 50 MCG/ML IJ SOSY: 25.0000 ug | PREFILLED_SYRINGE | INTRAMUSCULAR | Status: DC | PRN

## 2021-11-19 MED ORDER — SENNA 8.6 MG PO TABS
2.0000 | ORAL_TABLET | Freq: Every day | ORAL | Status: DC
  Filled 2021-11-19: qty 2

## 2021-11-19 MED ORDER — SODIUM CHLORIDE 0.9 % IV SOLN
2.0000 g | Freq: Once | INTRAVENOUS | Status: AC
  Administered 2021-11-19: 2 g via INTRAVENOUS
  Filled 2021-11-19: qty 20

## 2021-11-19 MED ORDER — SODIUM CHLORIDE 0.9 % IV SOLN: INTRAVENOUS | Status: AC

## 2021-11-19 MED ORDER — ONDANSETRON HCL 4 MG/2ML IJ SOLN: 4.0000 mg | INTRAMUSCULAR | Status: DC | PRN

## 2021-11-19 MED ORDER — TAMSULOSIN HCL 0.4 MG PO CAPS
0.4000 mg | ORAL_CAPSULE | Freq: Two times a day (BID) | ORAL | Status: DC
  Administered 2021-11-20: 0.4 mg via ORAL
  Filled 2021-11-19 (×2): qty 1

## 2021-11-19 MED ORDER — SODIUM CHLORIDE 0.9 % IV SOLN
INTRAVENOUS | Status: AC
  Filled 2021-11-19: qty 20

## 2021-11-19 MED ORDER — FENTANYL CITRATE (PF) 100 MCG/2ML IJ SOLN
INTRAMUSCULAR | Status: AC
  Filled 2021-11-19: qty 2

## 2021-11-19 MED ORDER — LIDOCAINE HCL (PF) 2 % IJ SOLN
INTRAMUSCULAR | Status: AC
  Filled 2021-11-19: qty 5

## 2021-11-19 MED ORDER — SODIUM CHLORIDE 0.9% FLUSH: 3.0000 mL | INTRAVENOUS | Status: DC | PRN

## 2021-11-19 MED ORDER — TRAMADOL HCL 50 MG PO TABS: 100.0000 mg | ORAL_TABLET | Freq: Four times a day (QID) | ORAL | Status: DC | PRN

## 2021-11-19 MED ORDER — SERTRALINE HCL 50 MG PO TABS: 50.0000 mg | ORAL_TABLET | Freq: Every day | ORAL | Status: DC

## 2021-11-19 MED ORDER — PROPOFOL 10 MG/ML IV BOLUS
INTRAVENOUS | Status: AC
  Filled 2021-11-19: qty 20

## 2021-11-19 MED ORDER — QUETIAPINE FUMARATE 25 MG PO TABS
50.0000 mg | ORAL_TABLET | Freq: Two times a day (BID) | ORAL | Status: DC
  Administered 2021-11-20: 50 mg via ORAL
  Filled 2021-11-19 (×2): qty 2

## 2021-11-19 MED ORDER — LIDOCAINE 2% (20 MG/ML) 5 ML SYRINGE
INTRAMUSCULAR | Status: DC | PRN
  Administered 2021-11-19: 80 mg via INTRAVENOUS

## 2021-11-19 MED ORDER — DEXAMETHASONE SODIUM PHOSPHATE 10 MG/ML IJ SOLN
INTRAMUSCULAR | Status: DC | PRN
  Administered 2021-11-19: 10 mg via INTRAVENOUS

## 2021-11-19 MED ORDER — THYROID 60 MG PO TABS
90.0000 mg | ORAL_TABLET | Freq: Every day | ORAL | Status: DC
  Administered 2021-11-20: 90 mg via ORAL
  Filled 2021-11-19: qty 1

## 2021-11-19 MED ORDER — OXYCODONE HCL 5 MG PO TABS: 5.0000 mg | ORAL_TABLET | Freq: Once | ORAL | Status: DC | PRN

## 2021-11-19 MED ORDER — ORAL CARE MOUTH RINSE: 15.0000 mL | Freq: Once | OROMUCOSAL | Status: DC

## 2021-11-19 MED ORDER — DIAZEPAM 5 MG PO TABS
5.0000 mg | ORAL_TABLET | Freq: Two times a day (BID) | ORAL | Status: DC | PRN
  Administered 2021-11-19: 5 mg via ORAL
  Filled 2021-11-19: qty 1

## 2021-11-19 MED ORDER — ACETAMINOPHEN 325 MG PO TABS
650.0000 mg | ORAL_TABLET | Freq: Four times a day (QID) | ORAL | Status: DC
  Administered 2021-11-20: 650 mg via ORAL
  Filled 2021-11-19 (×2): qty 2

## 2021-11-19 MED ORDER — FENTANYL CITRATE (PF) 100 MCG/2ML IJ SOLN
INTRAMUSCULAR | Status: DC | PRN
  Administered 2021-11-19 (×2): 25 ug via INTRAVENOUS

## 2021-11-19 MED ORDER — PROPOFOL 10 MG/ML IV BOLUS
INTRAVENOUS | Status: DC | PRN
  Administered 2021-11-19: 150 mg via INTRAVENOUS

## 2021-11-19 MED ORDER — LACTATED RINGERS IV SOLN: INTRAVENOUS | Status: DC

## 2021-11-19 MED ORDER — MELATONIN 5 MG PO TABS
10.0000 mg | ORAL_TABLET | Freq: Every day | ORAL | Status: DC
  Filled 2021-11-19: qty 2

## 2021-11-19 MED ORDER — SODIUM CHLORIDE 0.9 % IV SOLN
1.0000 g | INTRAVENOUS | Status: DC
  Filled 2021-11-19: qty 10

## 2021-11-19 MED ORDER — BUPIVACAINE HCL 0.25 % IJ SOLN
INTRAMUSCULAR | Status: AC
  Filled 2021-11-19: qty 1

## 2021-11-19 MED ORDER — BUPIVACAINE HCL 0.25 % IJ SOLN
INTRAMUSCULAR | Status: DC | PRN
  Administered 2021-11-19: 20 mL

## 2021-11-19 MED ORDER — MEMANTINE HCL 10 MG PO TABS
10.0000 mg | ORAL_TABLET | Freq: Two times a day (BID) | ORAL | Status: DC
  Filled 2021-11-19: qty 1

## 2021-11-19 SURGICAL SUPPLY — 18 items
BAG URO CATCHER STRL LF (MISCELLANEOUS) ×2 IMPLANT
CATH URETL OPEN END 6FR 70 (CATHETERS) IMPLANT
CLOTH BEACON ORANGE TIMEOUT ST (SAFETY) ×2 IMPLANT
COUNTER NEEDLE 20 DBL MAG RED (NEEDLE) ×1 IMPLANT
COVER SURGICAL LIGHT HANDLE (MISCELLANEOUS) ×1 IMPLANT
ELECT PENCIL ROCKER SW 15FT (MISCELLANEOUS) ×1 IMPLANT
GAUZE SPONGE 4X4 12PLY STRL (GAUZE/BANDAGES/DRESSINGS) ×1 IMPLANT
GLOVE SURG ENC TEXT LTX SZ7.5 (GLOVE) ×2 IMPLANT
GOWN STRL REUS W/TWL LRG LVL3 (GOWN DISPOSABLE) ×4 IMPLANT
GUIDEWIRE ZIPWRE .038 STRAIGHT (WIRE) IMPLANT
KIT TURNOVER KIT A (KITS) IMPLANT
MANIFOLD NEPTUNE II (INSTRUMENTS) ×2 IMPLANT
PACK CYSTO (CUSTOM PROCEDURE TRAY) ×2 IMPLANT
SUT SILK 0 SH 30 (SUTURE) ×2 IMPLANT
TAPE CLOTH SURG 4X10 WHT LF (GAUZE/BANDAGES/DRESSINGS) ×1 IMPLANT
TOWEL OR 17X26 10 PK STRL BLUE (TOWEL DISPOSABLE) ×1 IMPLANT
TUBING CONNECTING 10 (TUBING) ×2 IMPLANT
TUBING UROLOGY SET (TUBING) IMPLANT

## 2021-11-19 NOTE — Transfer of Care (Signed)
Immediate Anesthesia Transfer of Care Note  Patient: Mike Pennington  Procedure(s) Performed: CYSTOSCOPY WITH SUPRA PUBIC TUBE PLACEMENT  Patient Location: PACU  Anesthesia Type:General  Level of Consciousness: drowsy  Airway & Oxygen Therapy: Patient Spontanous Breathing and Patient connected to face mask oxygen  Post-op Assessment: Report given to RN and Post -op Vital signs reviewed and stable  Post vital signs: Reviewed and stable  Last Vitals:  Vitals Value Taken Time  BP 140/89 11/19/21 1538  Temp    Pulse 67 11/19/21 1539  Resp 26 11/19/21 1539  SpO2 96 % 11/19/21 1539  Vitals shown include unvalidated device data.  Last Pain:  Vitals:   11/19/21 1300  TempSrc: Oral         Complications: No notable events documented.

## 2021-11-19 NOTE — Op Note (Signed)
Preoperative diagnosis: Urinary retention  Postoperative diagnosis: Urinary retention  Procedures: 1.  Cystoscopy 2.  Suprapubic tube placement (22 [place])  Surgeon: Pryor Curia MD  Anesthesia: General  Complications: None  EBL: Minimal  Indication: Mike Pennington is a 71 year old gentleman who recently developed urinary retention.  He was unable to undergo a urodynamic study due to his severe dementia and inability to cooperate.  He does have a longstanding history of BPH and did undergo a transurethral resection of the prostate but was then able to void initially postoperatively.  He could not get voiding trial was done easily due to his combative nature with manipulation of his catheter.  He presents today for cystoscopy, further evaluation of his bladder outlet obstruction status post TURP, and suprapubic tube placement.  The potential risks, complications, and expected recovery process were discussed in detail.  Informed consent was obtained.  Description of procedure: The patient was taken the operating room and a general anesthetic was administered.  He was given preoperative antibiotics, placed in the dorsolithotomy position, and prepped and draped in the usual sterile fashion.  Next, a preoperative timeout was performed.  Cystourethroscopy was performed which revealed a normal anterior urethra.  There was evidence of his prior TURP but he did not appear to have significant obstructive tissue remaining.  His bladder was examined without evidence of bladder tumors.  There was expected bullous edema consistent with his indwelling catheter.  With his bladder filled, we were able to generate a very good urinary stream with suprapubic pressure also confirming that he did not appear to be obstructed.  Attention then turned to placement of the suprapubic tube.  A Lowsley retractor was inserted transurethrally and pushed up to the suprapubic region approximately 2 fingerbreadths above the  pubic symphysis.  An incision was made in the lower midline in this area and carried down through the rectus fascia with electrocautery.  The Lowsley retractor was able to push through the abdominal wall.  A 22 French catheter was then brought down with the Lowsley retractor into the bladder.  Its position was confirmed under cystoscopic visualization and the balloon was inflated with 30 cc of sterile water.  The catheter was secured with 0 silk sutures.  Reinspection of the bladder and the urethra revealed the suprapubic tube to be in the appropriate position without evidence of active bleeding.  No urethral catheter was left indwelling.  The patient tolerated the procedure well without complications.  He was able to be awakened and transferred to recovery in satisfactory condition.

## 2021-11-19 NOTE — Anesthesia Procedure Notes (Signed)
Procedure Name: LMA Insertion Date/Time: 11/19/2021 2:42 PM Performed by: Sharlette Dense, CRNA Patient Re-evaluated:Patient Re-evaluated prior to induction Oxygen Delivery Method: Circle system utilized Preoxygenation: Pre-oxygenation with 100% oxygen Induction Type: IV induction LMA: LMA inserted LMA Size: 4.0 Number of attempts: 1 Placement Confirmation: positive ETCO2 and breath sounds checked- equal and bilateral Tube secured with: Tape Dental Injury: Teeth and Oropharynx as per pre-operative assessment

## 2021-11-19 NOTE — Discharge Instructions (Signed)
You may see some blood in the urine and may have some burning with urination for 48-72 hours. You also may notice that you have to urinate more frequently or urgently after your procedure which is normal.  You should call should you develop an inability urinate, fever > 101, persistent nausea and vomiting that prevents you from eating or drinking to stay hydrated.  If you have a catheter, you will be taught how to take care of the catheter by the nursing staff prior to discharge from the hospital.  You may periodically feel a strong urge to void with the catheter in place.  This is a bladder spasm and most often can occur when having a bowel movement or moving around. It is typically self-limited and usually will stop after a few minutes. You may also see some blood in the urine.  A very small amount of blood can make the urine look quite red.  As long as the catheter is draining well, there usually is not a problem.  However, if the catheter is not draining well and is bloody, you should call the office [phone]) to notify us.

## 2021-11-19 NOTE — Anesthesia Postprocedure Evaluation (Signed)
Anesthesia Post Note  Patient: Mike Pennington  Procedure(s) Performed: CYSTOSCOPY WITH SUPRA PUBIC TUBE PLACEMENT     Patient location during evaluation: PACU Anesthesia Type: General Level of consciousness: awake and alert Pain management: pain level controlled Vital Signs Assessment: post-procedure vital signs reviewed and stable Respiratory status: spontaneous breathing, nonlabored ventilation and respiratory function stable Cardiovascular status: blood pressure returned to baseline Postop Assessment: no apparent nausea or vomiting Anesthetic complications: no   No notable events documented.  Last Vitals:  Vitals:   11/19/21 1545 11/19/21 1600  BP: 130/85 133/87  Pulse: 63 64  Resp: 10 16  Temp:    SpO2: 95% 98%    Last Pain:  Vitals:   11/19/21 1600  TempSrc:   PainSc: 0-No pain                 Marthenia Rolling

## 2021-11-19 NOTE — Anesthesia Preprocedure Evaluation (Addendum)
Anesthesia Evaluation  Patient identified by MRN, date of birth, ID band Patient awake    Reviewed: Allergy & Precautions, NPO status , Patient's Chart, lab work & pertinent test results  History of Anesthesia Complications Negative for: history of anesthetic complications  Airway Mallampati: II  TM Distance: >3 FB Neck ROM: Full    Dental no notable dental hx.    Pulmonary neg pulmonary ROS,    Pulmonary exam normal        Cardiovascular negative cardio ROS Normal cardiovascular exam     Neuro/Psych Anxiety Depression Dementia negative neurological ROS     GI/Hepatic negative GI ROS, Neg liver ROS,   Endo/Other  Hypothyroidism   Renal/GU negative Renal ROS     Musculoskeletal negative musculoskeletal ROS (+)   Abdominal   Peds  Hematology negative hematology ROS (+)   Anesthesia Other Findings BPH  Reproductive/Obstetrics negative OB ROS                            Anesthesia Physical Anesthesia Plan  ASA: 2  Anesthesia Plan: General   Post-op Pain Management: Tylenol PO (pre-op)   Induction: Intravenous  PONV Risk Score and Plan: 2 and Treatment may vary due to age or medical condition, Ondansetron and Dexamethasone  Airway Management Planned: LMA  Additional Equipment: None  Intra-op Plan:   Post-operative Plan: Extubation in OR  Informed Consent: I have reviewed the patients History and Physical, chart, labs and discussed the procedure including the risks, benefits and alternatives for the proposed anesthesia with the patient or authorized representative who has indicated his/her understanding and acceptance.     Dental advisory given and Consent reviewed with POA  Plan Discussed with:   Anesthesia Plan Comments:        Anesthesia Quick Evaluation

## 2021-11-20 ENCOUNTER — Encounter (HOSPITAL_COMMUNITY): Payer: Self-pay | Admitting: Urology

## 2021-11-20 DIAGNOSIS — N401 Enlarged prostate with lower urinary tract symptoms: Secondary | ICD-10-CM | POA: Diagnosis not present

## 2021-11-20 MED ORDER — PHENAZOPYRIDINE HCL 100 MG PO TABS: 100.0000 mg | ORAL_TABLET | Freq: Three times a day (TID) | ORAL | 0 refills | Status: AC | PRN

## 2021-11-20 MED ORDER — PHENAZOPYRIDINE HCL 100 MG PO TABS
100.0000 mg | ORAL_TABLET | Freq: Three times a day (TID) | ORAL | Status: DC
  Filled 2021-11-20: qty 1

## 2021-11-20 MED ORDER — DIAZEPAM 5 MG/ML IJ SOLN
2.5000 mg | Freq: Once | INTRAMUSCULAR | Status: AC
  Administered 2021-11-20: 2.5 mg via INTRAVENOUS
  Filled 2021-11-20: qty 2

## 2021-11-20 MED ORDER — KETOROLAC TROMETHAMINE 15 MG/ML IJ SOLN
15.0000 mg | Freq: Once | INTRAMUSCULAR | Status: AC
  Administered 2021-11-20: 15 mg via INTRAVENOUS
  Filled 2021-11-20: qty 1

## 2021-11-20 NOTE — TOC Transition Note (Signed)
Transition of Care [facility]) - CM/SW Discharge Note   Patient Details  Name: Mike Pennington MRN: [id number] Date of Birth: [birth year]  [facility] CM/SW Contact:  Dessa Phi, RN Phone Number: 11/20/2021, 11:39 AM   Clinical Narrative: Damaris Schooner to Vaughan Basta (spouse) about d/c plans-she has already started process for [facility])-CM will await dtr to come to hospital to discuss how CM can asst w/PTAR if needed.            Patient Goals and CMS Choice        Discharge Placement                       Discharge Plan and Services                                     Social Determinants of Health (SDOH) Interventions     Readmission Risk Interventions No flowsheet data found.

## 2021-11-20 NOTE — TOC Transition Note (Signed)
Transition of Care [facility]) - CM/SW Discharge Note   Patient Details  Name: YOUSOF ALDERMAN MRN: [id number] Date of Birth: [birth year]  Transition of [facility]) CM/SW Contact:  Dessa Phi, RN Phone Number: 11/20/2021, 2:55 PM   Clinical Narrative:  Karle Plumber rep-aware of prior to admission acceptance of patient  per spouse & liason. Confirmed rm#[phone],tel# for report #[phone],OVANVBTY nurse. PTAR called. No further CM needs.    Final next level of care: Assisted Living Barriers to Discharge: No Barriers Identified   Patient Goals and CMS Choice        Discharge Placement                       Discharge Plan and Services                                     Social Determinants of Health (SDOH) Interventions     Readmission Risk Interventions No flowsheet data found.

## 2021-11-20 NOTE — Progress Notes (Signed)
Patient becoming increasingly agitated, frequently wandering in hallway and attempting to hit staff/family that attempt to help him. Very impulsive and does not follow any safety protocols. Will not keep on gripper socks, frequently slips in the urine draining from his penis. His suprapubic catheter bag is secured to his leg with a stat lock, but pt walks around dragging bag on the floor, often tangled around his feet. Education provided but pt unable to safely follow commands or let staff help him. Per wife, she has provided some medication to patient on her own. Unsure of medications provided by family, but family educated on hospital's policy to only give our ordered medication for patient's safety. Per wife, plan is for patient to D/C with family after lunch. Will continue to monitor closely.

## 2021-11-20 NOTE — Care Management Obs Status (Signed)
[place] NOTIFICATION   Patient Details  Name: CEDERIC MOZLEY MRN: [id number] Date of Birth: [birth year]   Medicare Observation Status Notification Given:  Yes    MahabirJuliann Pulse, RN 11/20/2021, 2:47 PM

## 2021-11-20 NOTE — Progress Notes (Addendum)
Urology Progress Note   1 Day Post-Op from cystoscopy and suprapubic tube placement.   Subjective: An episode of agitation overnight for which he received a one-time dose of as needed diazepam.  Suprapubic catheter output slightly bloody this morning but thin.  Vital signs are stable.  Objective: Vital signs in last 24 hours: Temp:  [97.3 F (36.3 C)-97.9 F (36.6 C)] 97.3 F (36.3 C) (01/09 2012) Pulse Rate:  [62-94] 94 (01/09 2012) Resp:  [10-19] 18 (01/09 2012) BP: (108-140)/(74-90) 108/74 (01/09 2012) SpO2:  [93 %-100 %] 97 % (01/09 2012) Weight:  [82.6 kg] 82.6 kg (01/09 2208)  Intake/Output from previous day: 01/09 0701 - 01/10 0700 In: 1216.3 [P.O.:240; I.V.:876.3; IV Piggyback:100] Out: 325 [Urine:300; Blood:25] Intake/Output this shift: No intake/output data recorded.  Physical Exam:  General: Alert and oriented CV: Regular rate Lungs: No increased work of breathing Abdomen: Abdomen is soft and nontender.  Dressing over the suprapubic tube is mildly saturated. GU: Suprapubic tube draining thin blood-tinged urine without clot. Ext: NT, No erythema  Lab Results: No results for input(s): HGB, HCT in the last 72 hours. No results for input(s): NA, K, CL, CO2, GLUCOSE, BUN, CREATININE, CALCIUM in the last 72 hours.  Studies/Results: No results found.  Assessment/Plan:  71 y.o. male s/p suprapubic tube placement.    -We will make sure that the suprapubic tube was secured via StatLock. -Suprapubic care teaching with the nursing staff as well as supply with leg bag -Home medications -Likely discharge today    LOS: 0 days

## 2021-11-20 NOTE — Progress Notes (Addendum)
Patient ID: Mike Pennington, male   DOB: 05-23-1951, 71 y.o.   MRN: [id number]  1 Day Post-Op Subjective: Pt did well overnight.  No events.  Objective: Vital signs in last 24 hours: Temp:  [97.3 F (36.3 C)-97.9 F (36.6 C)] 97.3 F (36.3 C) (01/09 2012) Pulse Rate:  [62-94] 94 (01/09 2012) Resp:  [10-19] 18 (01/09 2012) BP: (108-140)/(74-90) 108/74 (01/09 2012) SpO2:  [93 %-100 %] 97 % (01/09 2012) Weight:  [82.6 kg] 82.6 kg (01/09 2208)  Intake/Output from previous day: 01/09 0701 - 01/10 0700 In: 1216.3 [P.O.:240; I.V.:876.3; IV Piggyback:100] Out: 325 [Urine:300; Blood:25] Intake/Output this shift: No intake/output data recorded.  Physical Exam:  General: Alert and oriented Abd: SP tube in place draining well GU: Urine red tinged but draining well   Assessment/Plan: S/P SP tube placement yesterday - D/C home today   LOS: 0 days   Dutch Gray 11/20/2021, 7:21 AM

## 2021-11-20 NOTE — Discharge Summary (Signed)
°  Date of admission: 11/19/2021  Date of discharge: 11/20/2021  Admission diagnosis: Urinary retention  Discharge diagnosis: Urinary retention  Secondary diagnoses: Dementia  History and Physical: For full details, please see admission history and physical. Briefly, Mike Pennington is a 71 y.o. year old patient with urinary retention despite prior TURP.   Hospital Course: He underwent cystoscopy which did not reveal findings that suggested persistent obstruction s/p TURP.  An SP tube was placed.  At the request of his wife, he was admitted for observation overnight.  He remained stable overnight and was discharged home the following morning.  Disposition: Home  Discharge instruction: The patient was instructed to be ambulatory but told to refrain from heavy lifting, strenuous activity, or driving.   Discharge medications:  Allergies as of 11/20/2021       Reactions   Ambien [zolpidem] Other (See Comments)   Erratic behavior   Ativan [lorazepam] Other (See Comments)   Per family member, aggression with ativan        Medication List     TAKE these medications    acetaminophen 325 MG tablet Commonly known as: TYLENOL Take 650 mg by mouth every 6 (six) hours as needed for moderate pain.   diazepam 10 MG tablet Commonly known as: VALIUM Take 10 mg by mouth every 6 (six) hours as needed for anxiety.   docusate sodium 100 MG capsule Commonly known as: Colace Take 1 capsule (100 mg total) by mouth 2 (two) times daily.   Ferrous Sulfate 90 (18 Fe) MG Tabs Take 18 mg by mouth daily.   finasteride 5 MG tablet Commonly known as: PROSCAR Take 5 mg by mouth daily.   Melatonin 10 MG Tabs Take by mouth.   memantine 10 MG tablet Commonly known as: NAMENDA Take 1 tablet (10 mg at night) for 2 weeks, then increase to 1 tablet (10 mg) twice a day What changed:  how much to take how to take this when to take this additional instructions   polyethylene glycol 17 g  packet Commonly known as: MIRALAX / GLYCOLAX Take 17 g by mouth daily. Please start if patient having constipation, and hold for diarrhea. What changed:  how much to take additional instructions   QUEtiapine 25 MG tablet Commonly known as: SEROQUEL Please take 25 mg (1 Tablet) oral daily in AM, and 50 mg (2 Tablets) oral at bedtime. What changed:  how much to take how to take this when to take this additional instructions   senna 8.6 MG tablet Commonly known as: SENOKOT Take 2 tablets by mouth daily.   sertraline 50 MG tablet Commonly known as: ZOLOFT Take 50 mg by mouth daily.   SOLUBLE FIBER/PROBIOTICS PO Take 2 tablets by mouth daily.   tamsulosin 0.4 MG Caps capsule Commonly known as: FLOMAX Take 0.4 mg by mouth 2 (two) times daily.   thyroid 90 MG tablet Commonly known as: ARMOUR Take 90 mg by mouth daily.        Followup:   Follow-up Information     Raynelle Bring, MD Follow up.   Specialty: Urology Why: 01/01/22 at 9:00 AM Contact information: [place] [id number] [phone]

## 2021-12-11 ENCOUNTER — Other Ambulatory Visit: Payer: Self-pay

## 2021-12-11 ENCOUNTER — Encounter (HOSPITAL_COMMUNITY): Payer: Self-pay | Admitting: *Deleted

## 2021-12-11 ENCOUNTER — Emergency Department (HOSPITAL_COMMUNITY)
Admission: EM | Admit: 2021-12-11 | Discharge: 2021-12-11 | Disposition: A | Discharge status: Skilled Nursing Facility | Payer: Medicare PPO | Attending: Emergency Medicine | Admitting: Emergency Medicine

## 2021-12-11 DIAGNOSIS — F0390 Unspecified dementia without behavioral disturbance: Secondary | ICD-10-CM | POA: Diagnosis not present

## 2021-12-11 DIAGNOSIS — Y828 Other medical devices associated with adverse incidents: Secondary | ICD-10-CM | POA: Insufficient documentation

## 2021-12-11 DIAGNOSIS — T83090A Other mechanical complication of cystostomy catheter, initial encounter: Secondary | ICD-10-CM | POA: Diagnosis not present

## 2021-12-11 NOTE — ED Notes (Signed)
ED Provider at bedside. 

## 2021-12-11 NOTE — Discharge Instructions (Signed)
Catheter is in place today.  The balloon is inflated and there is no sign that there is urine building up.  Continue localized wound care with a dressing around the site to prevent irritation from his diaper.

## 2021-12-11 NOTE — ED Provider Notes (Signed)
[facility] DEPT Provider Note   CSN: [id number] Arrival date & time: 12/11/21  2009     History  Chief Complaint  Patient presents with   Urinary Catheter Problem    Mike Pennington is a 71 y.o. male.  Patient is a 71 year old male with a history of depression, anxiety, advanced dementia who is presenting today with EMS from [facility].  Patient does not give any history but his wife is at bedside and provides the full history.  Patient had a suprapubic catheter placed on 11/20/2021 after he had persistent inability to urinate for the last 8 months and recurrent UTIs.  He has been at [facility] since the 10th of this month and his wife reports that he is doing okay but he is not sleeping well, constantly roaming around and not getting any rest and eating moderately okay.  They called her tonight reporting that he pulled on his catheter and they were concerned because there was some blood in the urine.  She was with him until 630 this evening and he seemed his normal self.  She emptied his catheter bag before she left.  He takes no anticoagulation.  The history is provided by the spouse and medical records.      Home Medications Prior to Admission medications   Medication Sig Start Date End Date Taking? Authorizing Provider  acetaminophen (TYLENOL) 325 MG tablet Take 650 mg by mouth every 6 (six) hours as needed for moderate pain.    [provider]  diazepam (VALIUM) 10 MG tablet Take 10 mg by mouth every 6 (six) hours as needed for anxiety.    [provider]  docusate sodium (COLACE) 100 MG capsule Take 1 capsule (100 mg total) by mouth 2 (two) times daily. Patient not taking: Reported on 10/25/2021 06/14/21 06/14/22  Elgergawy, Silver Huguenin, MD  Ferrous Sulfate 90 (18 Fe) MG TABS Take 18 mg by mouth daily.    [provider]  finasteride (PROSCAR) 5 MG tablet Take 5 mg by mouth daily.    [provider]  Melatonin  10 MG TABS Take by mouth.    [provider]  memantine (NAMENDA) 10 MG tablet Take 1 tablet (10 mg at night) for 2 weeks, then increase to 1 tablet (10 mg) twice a day Patient taking differently: Take 10 mg by mouth 2 (two) times daily. 07/04/21   Rondel Jumbo, PA-C  phenazopyridine (PYRIDIUM) 100 MG tablet Take 1 tablet (100 mg total) by mouth 3 (three) times daily as needed for pain. 11/20/21   Bishop Limbo, MD  polyethylene glycol (MIRALAX / GLYCOLAX) 17 g packet Take 17 g by mouth daily. Please start if patient having constipation, and hold for diarrhea. Patient taking differently: Take 8.5 g by mouth daily. 06/15/21   Elgergawy, Silver Huguenin, MD  Probiotic Product (SOLUBLE FIBER/PROBIOTICS PO) Take 2 tablets by mouth daily.    [provider]  QUEtiapine (SEROQUEL) 25 MG tablet Please take 25 mg (1 Tablet) oral daily in AM, and 50 mg (2 Tablets) oral at bedtime. Patient taking differently: Take 50 mg by mouth 2 (two) times daily. 06/14/21   Elgergawy, Silver Huguenin, MD  senna (SENOKOT) 8.6 MG tablet Take 2 tablets by mouth daily.    [provider]  sertraline (ZOLOFT) 50 MG tablet Take 50 mg by mouth daily.    [provider]  tamsulosin (FLOMAX) 0.4 MG CAPS capsule Take 0.4 mg by mouth 2 (two) times daily.  05/29/21   [provider]  thyroid (ARMOUR) 90 MG tablet Take 90 mg by mouth daily.    [provider]      Allergies    Ambien [zolpidem] and Ativan [lorazepam]    Review of Systems   Review of Systems  Physical Exam Updated Vital Signs BP 114/80 (BP Location: Right Arm)    Pulse 73    Temp 98.1 F (36.7 C) (Oral)    Resp 16    Ht 6' (1.829 m)    Wt 82 kg    SpO2 97%    BMI 24.52 kg/m  Physical Exam Vitals and nursing note reviewed.  Constitutional:      General: He is not in acute distress.    Appearance: Normal appearance.  HENT:     Head: Normocephalic.  Cardiovascular:     Rate and Rhythm: Normal rate.  Pulmonary:      Effort: Pulmonary effort is normal.  Abdominal:     Comments: Abdomen is soft and nontender.  Patient has a suprapubic catheter in place with some mild surrounding erythema and exudate.  With pulling on the catheter it appears to be intact.  Unable to pull it out.  There is urine in the catheter bag.  Patient is sleeping and otherwise does not appear to be in any discomfort  Musculoskeletal:     Right lower leg: No edema.     Left lower leg: No edema.  Neurological:     Mental Status: He is alert.  Psychiatric:     Comments: Sleeping    ED Results / Procedures / Treatments   Labs (all labs ordered are listed, but only abnormal results are displayed) Labs Reviewed - No data to display  EKG None  Radiology No results found.  Procedures Procedures    Medications Ordered in ED Medications - No data to display  ED Course/ Medical Decision Making/ A&P                           Medical Decision Making  Patient is a 71 year old male presenting today with concern for problem with his suprapubic catheter.  He had it placed on the ninth of this month for inability to urinate and cognitive decline.  On exam catheter appears to be in place.  He does have urine in his Foley catheter bag.  There are some mild exudate around the catheter site and some minimal erythema does not appear to be significantly infected.  Bedside ultrasound shows intact Foley catheter balloon and no significant urinary retention.  At this time do not feel that patient needs any further intervention.  Discussed all the findings with his wife.  Reviewed his most recent external medical records when the catheter was placed.  At this time localized wound care around the catheter site was done.  He otherwise is in no acute distress and low suspicion for new infection.  Will discharge back to his facility.  No indication for admission today.  No social determinants affecting his care today.        Final Clinical  Impression(s) / ED Diagnoses Final diagnoses:  Mechanical complication of suprapubic catheter, initial encounter [facility])    Rx / DC Orders ED Discharge Orders     None         Blanchie Dessert, MD 12/11/21 2121

## 2021-12-11 NOTE — ED Triage Notes (Addendum)
Pt arrives via [facility] from [facility] assisted living (dementia care unit) per verbal medic report, the pt partially pulled out his suprapubic catheter. Denies pain, nontender. No fever. 126/78, hr 74, rr 18, 95% ra, 97.7, cbg 129. DNR.

## 2021-12-14 ENCOUNTER — Telehealth: Payer: Self-pay | Admitting: Physician Assistant

## 2021-12-14 NOTE — Telephone Encounter (Signed)
Patients wife called and stated they need a letter that states [facility] diagnosis and his need for guardianship.  It needs her professional opinion of his capacity.  She said it could be mailed to them?  I informed her that clinical staff may need to call her for more information.

## 2022-01-04 NOTE — Telephone Encounter (Signed)
Pt's wife called back in stating she never got this message. She doesn't check mychart often. She says she will take a letter about his diagnosis.

## 2022-02-06 ENCOUNTER — Telehealth: Payer: Medicare PPO | Admitting: Physician Assistant

## 2022-04-27 ENCOUNTER — Emergency Department (HOSPITAL_COMMUNITY)
Admission: EM | Admit: 2022-04-27 | Discharge: 2022-04-27 | Disposition: A | Discharge status: Home / Self Care | Attending: Emergency Medicine | Admitting: Emergency Medicine

## 2022-04-27 ENCOUNTER — Encounter (HOSPITAL_COMMUNITY): Payer: Self-pay

## 2022-04-27 ENCOUNTER — Other Ambulatory Visit: Payer: Self-pay

## 2022-04-27 DIAGNOSIS — T83020A Displacement of cystostomy catheter, initial encounter: Secondary | ICD-10-CM | POA: Insufficient documentation

## 2022-04-27 DIAGNOSIS — Y828 Other medical devices associated with adverse incidents: Secondary | ICD-10-CM | POA: Insufficient documentation

## 2022-04-27 DIAGNOSIS — T839XXA Unspecified complication of genitourinary prosthetic device, implant and graft, initial encounter: Secondary | ICD-10-CM

## 2022-04-27 DIAGNOSIS — F0390 Unspecified dementia without behavioral disturbance: Secondary | ICD-10-CM | POA: Diagnosis not present

## 2022-04-27 MED ORDER — ALPRAZOLAM 0.25 MG PO TABS
0.5000 mg | ORAL_TABLET | Freq: Once | ORAL | Status: AC
  Administered 2022-04-27: 0.5 mg via ORAL
  Filled 2022-04-27: qty 2

## 2022-04-27 NOTE — ED Notes (Signed)
Family at bedside & is bringing pt back to his facility.

## 2022-04-27 NOTE — ED Triage Notes (Signed)
Pt BIB [facility] from home d/t pulling his own urinary catheter out of facility. He is Alert to self at baseline with dementia. VSS. Arrived to ED with leg bag still attached to his Left leg & catheter hasd been reinserted by [facility] staff, pt c/o pain in that area.

## 2022-04-27 NOTE — ED Notes (Signed)
Help get patient into a gown on the monitor did ekg shown to er provider patient is resting with call bell in reach

## 2022-04-27 NOTE — ED Notes (Signed)
Pt's suprapubic catheter was replaced by EDP HONG.

## 2022-04-27 NOTE — ED Provider Notes (Signed)
[facility] Provider Note   CSN: [id number] Arrival date & time: 04/27/22  1043     History  Chief Complaint  Patient presents with   Catheter problems    [facility] is a 71 y.o. male.  Patient from nursing home with history of dementia presents with complaint of having pulled out his Foley catheter.  Otherwise unable to obtain additional history or review of systems given dementia.  Nursing staff denies any recent fevers cough vomiting or diarrhea.  Complaining of pain to his catheter insertion site.       Home Medications Prior to Admission medications   Medication Sig Start Date End Date Taking? Authorizing Provider  acetaminophen (TYLENOL) 325 MG tablet Take 650 mg by mouth every 6 (six) hours as needed for moderate pain.    [provider]  diazepam (VALIUM) 10 MG tablet Take 10 mg by mouth every 6 (six) hours as needed for anxiety.    [provider]  docusate sodium (COLACE) 100 MG capsule Take 1 capsule (100 mg total) by mouth 2 (two) times daily. Patient not taking: Reported on 10/25/2021 06/14/21 06/14/22  Elgergawy, Silver Huguenin, MD  Ferrous Sulfate 90 (18 Fe) MG TABS Take 18 mg by mouth daily.    [provider]  finasteride (PROSCAR) 5 MG tablet Take 5 mg by mouth daily.    [provider]  Melatonin 10 MG TABS Take by mouth.    [provider]  memantine (NAMENDA) 10 MG tablet Take 1 tablet (10 mg at night) for 2 weeks, then increase to 1 tablet (10 mg) twice a day Patient taking differently: Take 10 mg by mouth 2 (two) times daily. 07/04/21   Rondel Jumbo, PA-C  phenazopyridine (PYRIDIUM) 100 MG tablet Take 1 tablet (100 mg total) by mouth 3 (three) times daily as needed for pain. 11/20/21   Bishop Limbo, MD  polyethylene glycol (MIRALAX / GLYCOLAX) 17 g packet Take 17 g by mouth daily. Please start if patient having constipation, and hold for diarrhea. Patient taking  differently: Take 8.5 g by mouth daily. 06/15/21   Elgergawy, Silver Huguenin, MD  Probiotic Product (SOLUBLE FIBER/PROBIOTICS PO) Take 2 tablets by mouth daily.    [provider]  QUEtiapine (SEROQUEL) 25 MG tablet Please take 25 mg (1 Tablet) oral daily in AM, and 50 mg (2 Tablets) oral at bedtime. Patient taking differently: Take 50 mg by mouth 2 (two) times daily. 06/14/21   Elgergawy, Silver Huguenin, MD  senna (SENOKOT) 8.6 MG tablet Take 2 tablets by mouth daily.    [provider]  sertraline (ZOLOFT) 50 MG tablet Take 50 mg by mouth daily.    [provider]  tamsulosin (FLOMAX) 0.4 MG CAPS capsule Take 0.4 mg by mouth 2 (two) times daily. 05/29/21   [provider]  thyroid (ARMOUR) 90 MG tablet Take 90 mg by mouth daily.    [provider]      Allergies    Ambien [zolpidem] and Ativan [lorazepam]    Review of Systems   Review of Systems  Unable to perform ROS: Dementia    Physical Exam Updated Vital Signs BP (!) 135/96 (BP Location: Right Arm)   Pulse 70   Temp 98.7 F (37.1 C) (Oral)   Resp 18   SpO2 100%  Physical Exam Constitutional:      Appearance: He is well-developed.  HENT:     Head: Normocephalic.     Nose:  Nose normal.  Eyes:     Extraocular Movements: Extraocular movements intact.  Cardiovascular:     Rate and Rhythm: Normal rate.  Pulmonary:     Effort: Pulmonary effort is normal.  Abdominal:     Comments: There is soft nondistended nontender no guarding or rebound.  Foley catheter insertion site has scant amount of blood otherwise no cellulitis noted.  Skin:    Coloration: Skin is not jaundiced.  Neurological:     Mental Status: He is alert. Mental status is at baseline.     ED Results / Procedures / Treatments   Labs (all labs ordered are listed, but only abnormal results are displayed) Labs Reviewed - No data to display  EKG None  Radiology No results found.  Procedures SUPRAPUBIC TUBE  PLACEMENT  Date/Time: 04/27/2022 12:44 PM  Performed by: Luna Fuse, MD Authorized by: Luna Fuse, MD   Anesthesia:    Anesthesia method:  None Procedure details:    Complexity:  Simple   Urine characteristics:  Blood-tinged Post-procedure details:    Procedure completion:  Tolerated     Medications Ordered in ED Medications  ALPRAZolam Duanne Moron) tablet 0.5 mg (0.5 mg Oral Given 04/27/22 1227)    ED Course/ Medical Decision Making/ A&P                           Medical Decision Making Risk Prescription drug management.   Review of records shows a telephone call December 18, 2021 with neurology.  Cardiac monitoring showing sinus rhythm.  No additional diagnosis lines were sent.  Reinsertion of Foley catheter to suprapubic incision site performed myself.  Patient tolerated procedure well subsequent outflow of blood-tinged urine noted.  Balloon inflated and patient set for discharge home.  Family had called and asked patient to be given a Xanax due to his anxiety.  Recommend outpatient follow-up with his doctors within the week recommend follow-up with urology within the week recommend immediate return for worsening symptoms fevers or any additional concerns.        Final Clinical Impression(s) / ED Diagnoses Final diagnoses:  Problem with Foley catheter, initial encounter [facility])    Rx / DC Orders ED Discharge Orders     None         Luna Fuse, MD 04/27/22 1244

## 2022-04-27 NOTE — Discharge Instructions (Addendum)
Call your primary care doctor or specialist as discussed in the next 2-3 days.   Return immediately back to the ER if:  Your symptoms worsen within the next 12-24 hours. You develop new symptoms such as new fevers, persistent vomiting, new pain, shortness of breath, or new weakness or numbness, or if you have any other concerns.  

## 2022-06-08 IMAGING — CT CT ABD-PELV W/ CM
2 of 6 series · 13 of 46 positions shown, 15 images · IV contrast (Omnipaque)
Comparison: CT 02/08/2021

CLINICAL DATA: Constipation.  Bowel obstruction suspected.

EXAM:
CT ABDOMEN AND PELVIS WITH CONTRAST
TECHNIQUE: Multidetector CT imaging of the abdomen and pelvis was performed
using the standard protocol following bolus administration of
intravenous contrast.
CONTRAST:  <See Chart> OMNIPAQUE IOHEXOL 300 MG/ML  SOLN

[Series 5: coronal st · coronal · 1.11mm/px · 3 of 100 slices shown]
[im 34/100  soft-tissue]
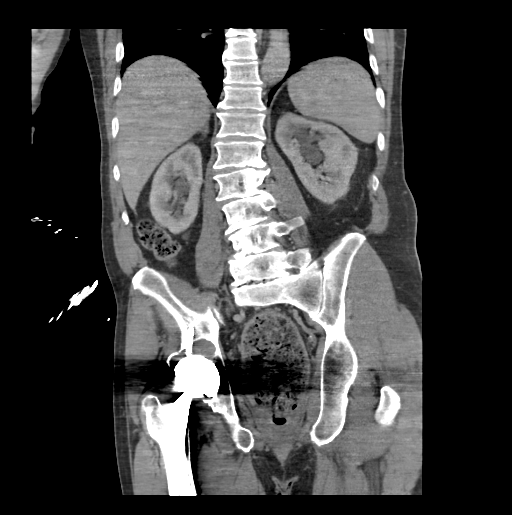
[im 45/100  soft-tissue]
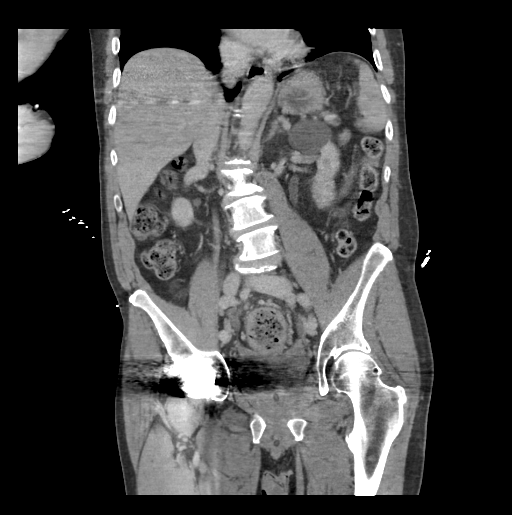
[im 56/100  soft-tissue]
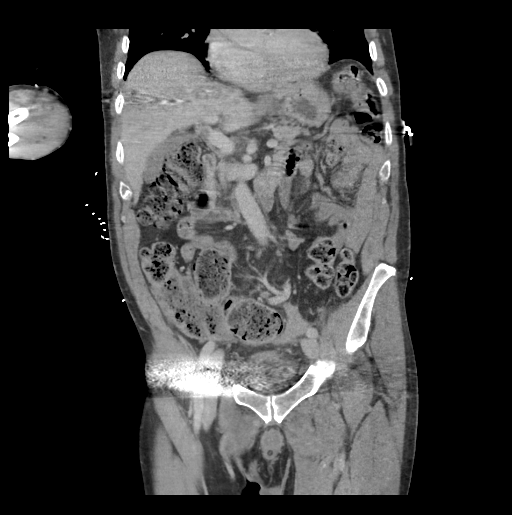

[Series 8: axial (person_name) for hip artifact (person_name) · axial · 0.97mm/px · z∈[+887,+1307]mm · 10 of 104 slices shown, 12 images]
[im 10/104  soft-tissue]
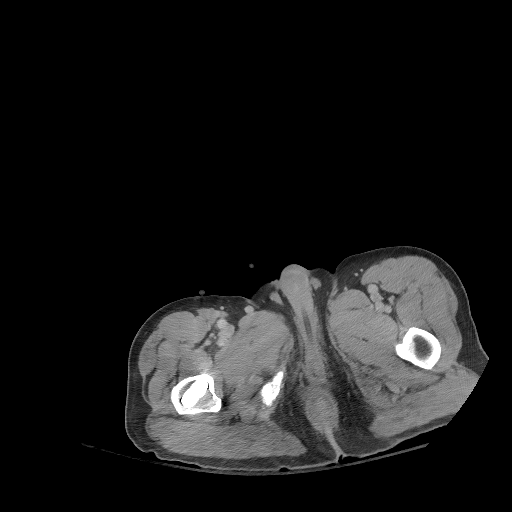
[im 10/104  bone]
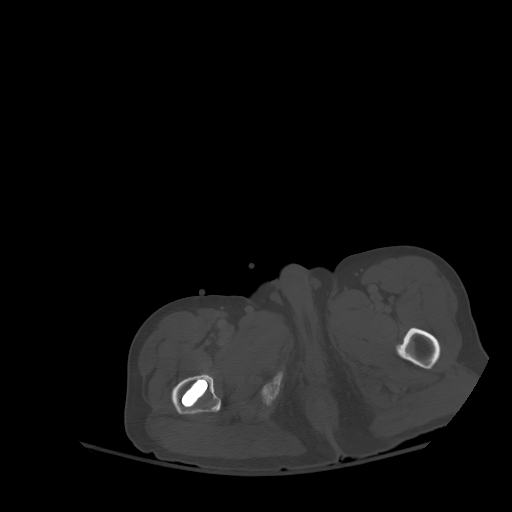
[im 19/104  soft-tissue]
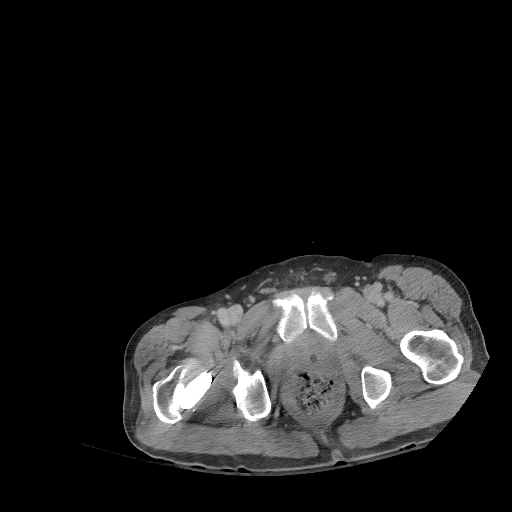
[im 29/104  soft-tissue]
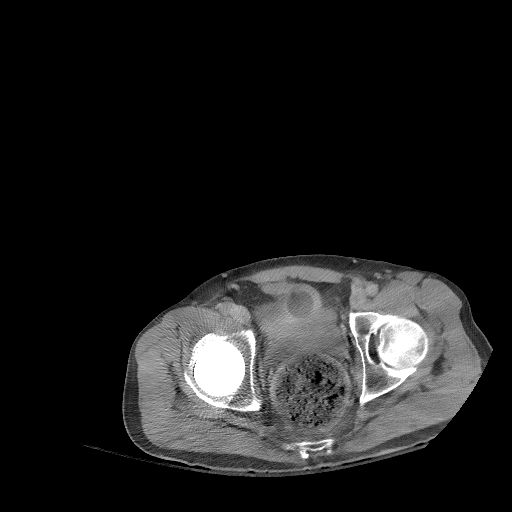
[im 38/104  soft-tissue]
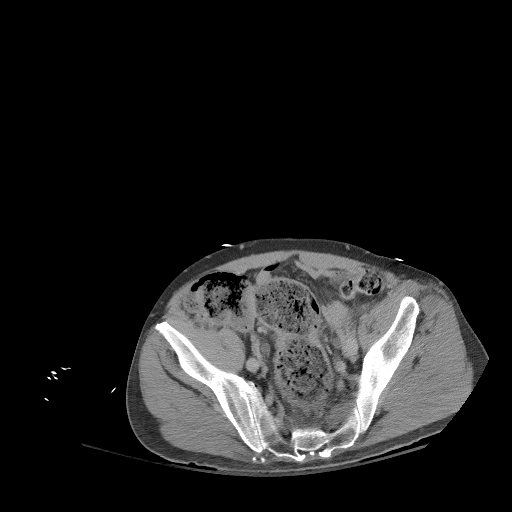
[im 47/104  soft-tissue]
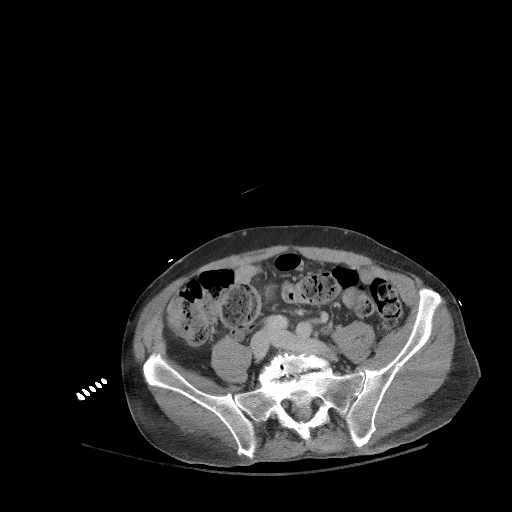
[im 57/104  soft-tissue]
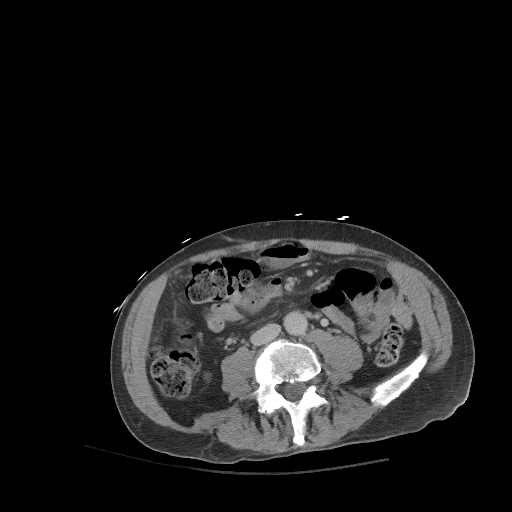
[im 66/104  soft-tissue]
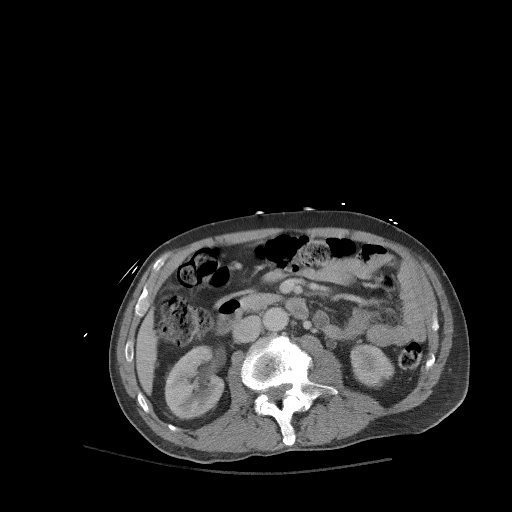
[im 75/104  soft-tissue]
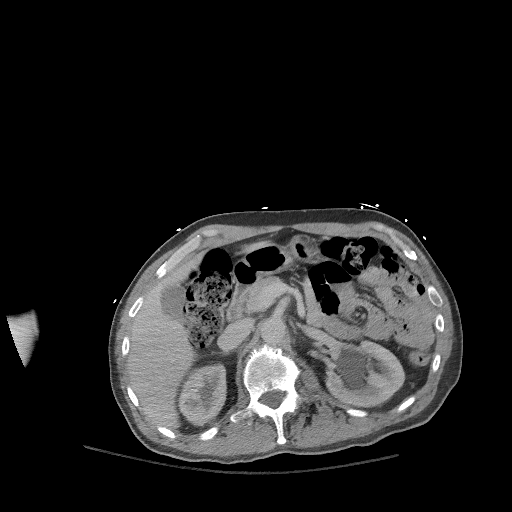
[im 85/104  soft-tissue]
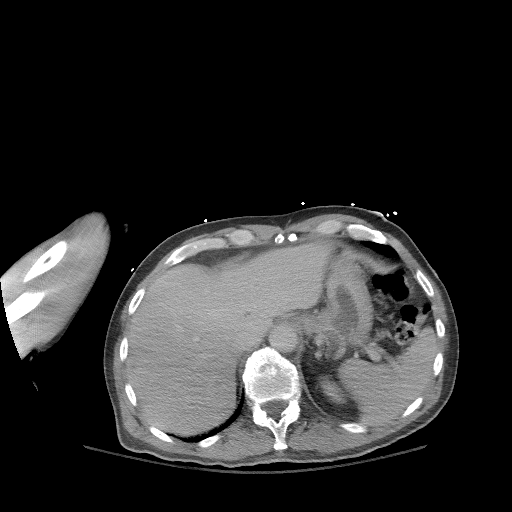
[im 85/104  bone]
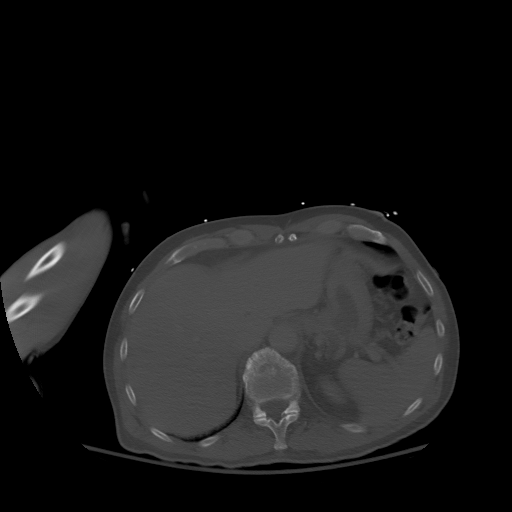
[im 94/104  soft-tissue]
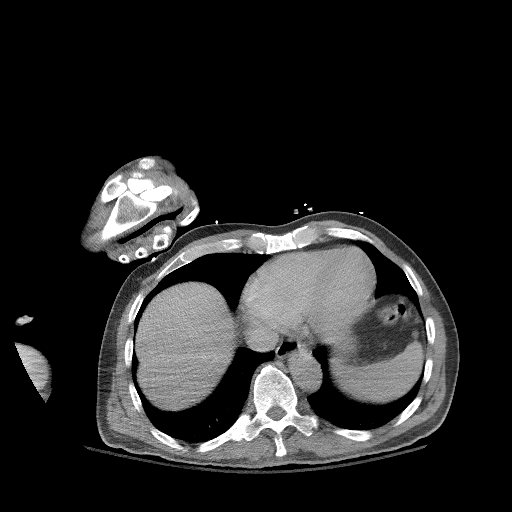

[13 of 46 positions shown; findings below may reference images not displayed]

FINDINGS: Lower chest: No acute findings allowing for motion artifact.

Hepatobiliary: Artifact from patient's arm overlying the upper
abdomen with streak artifact from watch. Few subcentimeter
hypodensities in the liver not significantly changed from prior
exam, allowing for streak and motion artifact. Unremarkable
gallbladder.

Pancreas: No ductal dilatation or inflammation.

Spleen: Normal in size without focal abnormality.

Adrenals/Urinary Tract: No adrenal nodule. There is mild bilateral
hydroureteronephrosis. The ureter is dilated to the bladder
insertion. No visualized stone or cause for obstruction. There is a
15 mm nonobstructing stone in the lower left kidney. Bilateral renal
cysts including parapelvic cysts in the left kidney. No perinephric
edema. Symmetric excretion on delayed phase imaging. Foley catheter
decompresses the urinary bladder.

Stomach/Bowel: There is a large volume of stool throughout the
entire colon with colonic tortuosity. The rectum is distended with
rectal diameter of 8 cm. Mild rectal wall thickening and perirectal
fat stranding. There is no pneumatosis or perforation. Normal
appendix. There is no small bowel dilatation or evidence of
obstruction. The stomach is decompressed.

Vascular/Lymphatic: Aortic atherosclerosis and tortuosity. No aortic
aneurysm. Patent portal vein. No portal venous or mesenteric gas. No
bulky abdominopelvic adenopathy.

Reproductive: Prominent prostate spanning 5.9 cm.

Other: No free air, free fluid, or intra-abdominal fluid collection.

Musculoskeletal: Scoliosis with multilevel degenerative change in
the spine. Right hip arthroplasty. Lobulated lucency adjacent to the
right acetabular cup similar to prior exam and typical of particle
disease.
IMPRESSION: 1. Large volume of stool throughout the entire colon with colonic
tortuosity, consistent with constipation. The rectum is distended
with rectal diameter of 8 cm. Mild rectal wall thickening and
perirectal fat stranding, suspicious for fecal impaction.
2. Mild bilateral hydroureteronephrosis to the bladder insertion. No
visualized stone or cause for obstruction. Nonobstructing stone in
the lower left kidney. B
3. Prominent prostate spanning 5.9 cm.

Aortic Atherosclerosis ([id number]).

## 2022-11-11 DEATH — deceased

## 2022-11-13 IMAGING — DX DG CHEST 1V
1 series · 1 of 1 positions shown · non-contrast
Comparison: None.

CLINICAL DATA: Rule out tuberculosis

EXAM:
CHEST  1 VIEW

[chest pa]
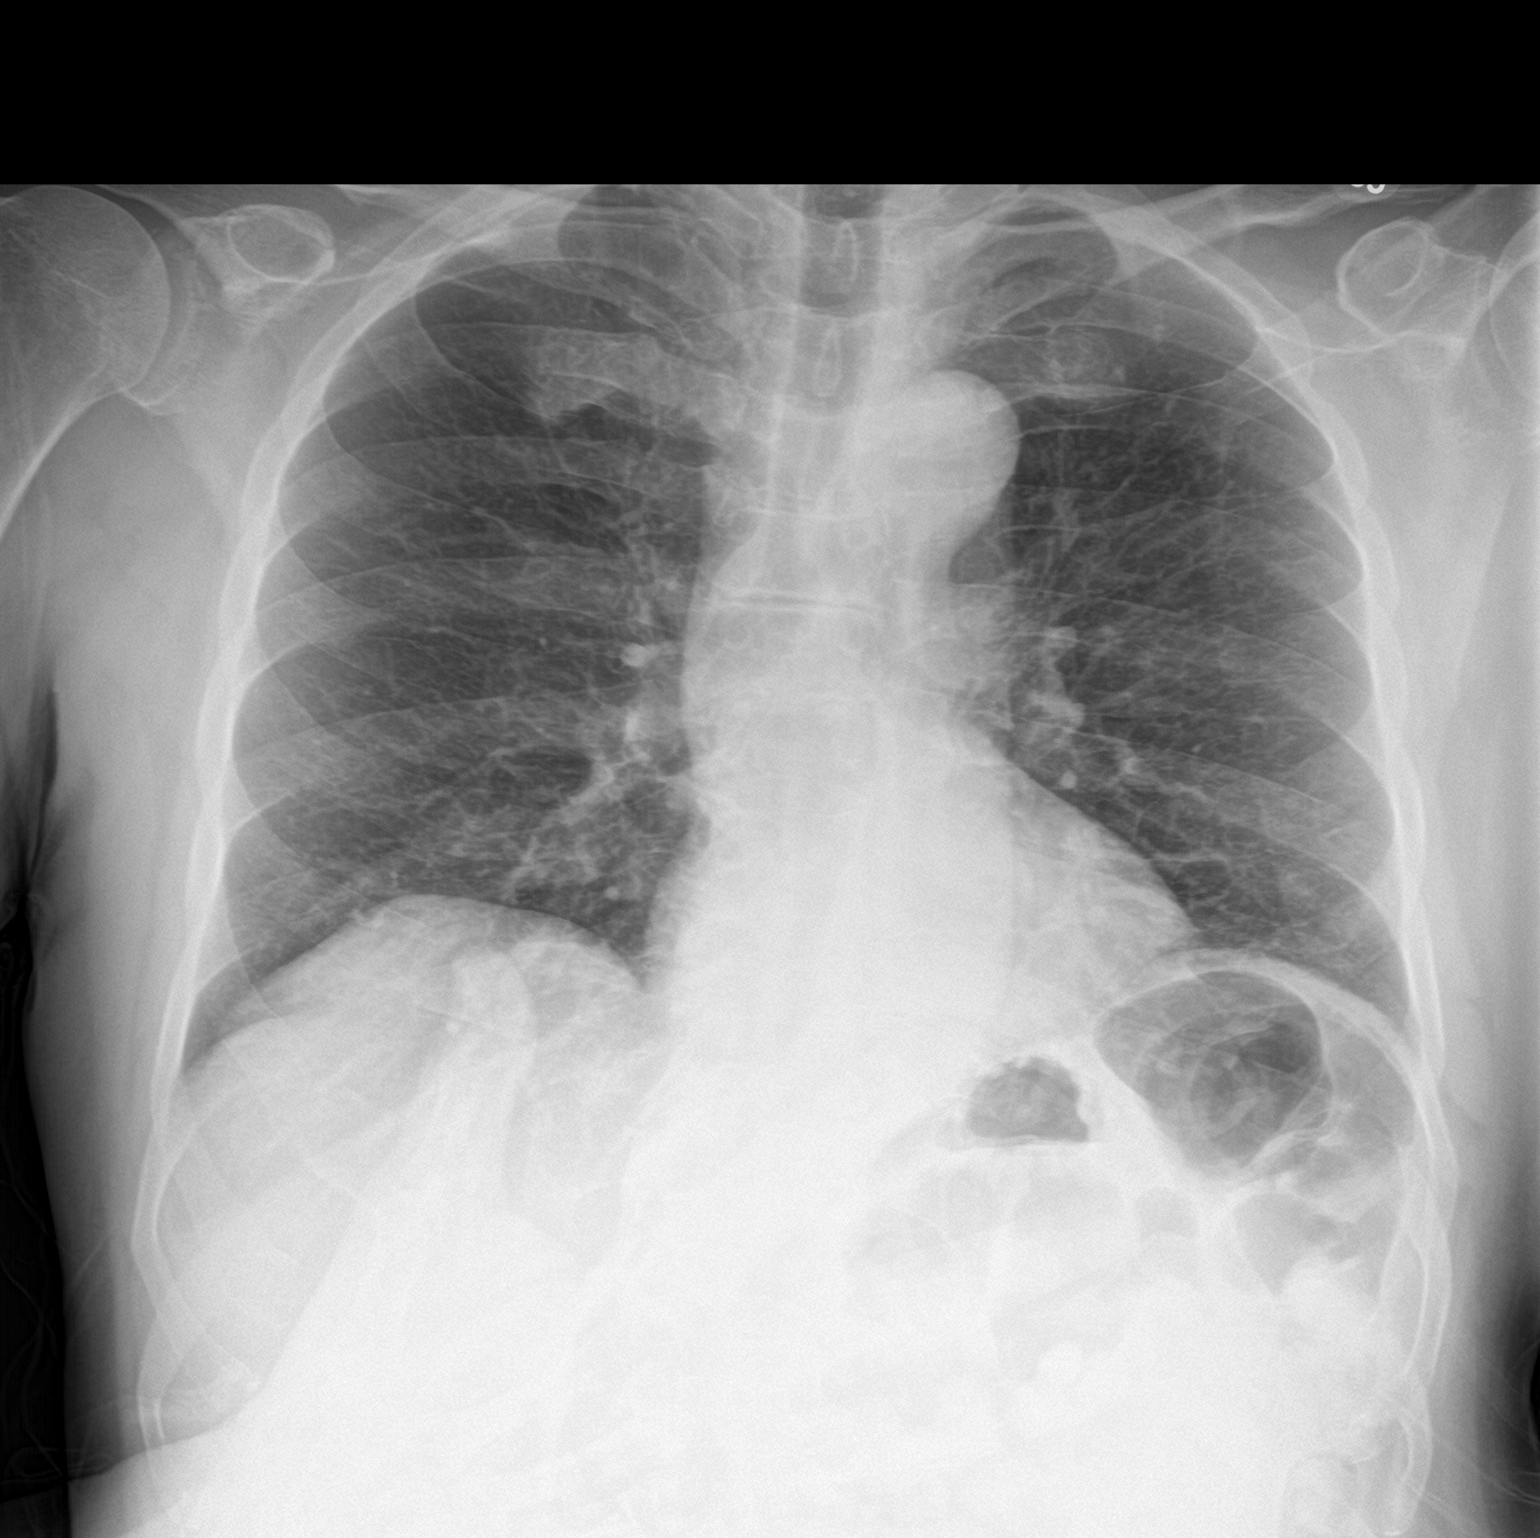

[1 of 1 positions shown; findings below may reference images not displayed]

FINDINGS: Heart and mediastinal contours are within normal limits. No focal
opacities or effusions. No acute bony abnormality.
IMPRESSION: No active disease.
